# Patient Record
Sex: Male | Born: 1935 | Race: White | Hispanic: No | Marital: Married | Smoking: Former smoker
Health system: Southern US, Community
[De-identification: ages and names within clinical notes are randomized; demographics above are authoritative.]

## PROBLEM LIST (undated history)

## (undated) DIAGNOSIS — I10 Essential (primary) hypertension: Secondary | ICD-10-CM

## (undated) DIAGNOSIS — F419 Anxiety disorder, unspecified: Secondary | ICD-10-CM

## (undated) DIAGNOSIS — E039 Hypothyroidism, unspecified: Secondary | ICD-10-CM

## (undated) DIAGNOSIS — C4490 Unspecified malignant neoplasm of skin, unspecified: Secondary | ICD-10-CM

## (undated) DIAGNOSIS — F329 Major depressive disorder, single episode, unspecified: Secondary | ICD-10-CM

## (undated) DIAGNOSIS — E119 Type 2 diabetes mellitus without complications: Secondary | ICD-10-CM

## (undated) DIAGNOSIS — F32A Depression, unspecified: Secondary | ICD-10-CM

## (undated) HISTORY — PX: SKIN CANCER EXCISION: SHX779

---

## 2016-05-12 ENCOUNTER — Encounter: Payer: Self-pay | Admitting: Emergency Medicine

## 2016-05-12 ENCOUNTER — Emergency Department: Payer: Medicare Other

## 2016-05-12 ENCOUNTER — Observation Stay
Admission: EM | Admit: 2016-05-12 | Discharge: 2016-05-13 | Disposition: A | Discharge status: Home / Self Care | Payer: Medicare Other | Attending: Internal Medicine | Admitting: Internal Medicine

## 2016-05-12 DIAGNOSIS — F329 Major depressive disorder, single episode, unspecified: Secondary | ICD-10-CM | POA: Insufficient documentation

## 2016-05-12 DIAGNOSIS — R531 Weakness: Secondary | ICD-10-CM

## 2016-05-12 DIAGNOSIS — R2681 Unsteadiness on feet: Secondary | ICD-10-CM

## 2016-05-12 DIAGNOSIS — J101 Influenza due to other identified influenza virus with other respiratory manifestations: Secondary | ICD-10-CM | POA: Diagnosis not present

## 2016-05-12 DIAGNOSIS — E1165 Type 2 diabetes mellitus with hyperglycemia: Secondary | ICD-10-CM | POA: Insufficient documentation

## 2016-05-12 DIAGNOSIS — Z87891 Personal history of nicotine dependence: Secondary | ICD-10-CM | POA: Diagnosis not present

## 2016-05-12 DIAGNOSIS — I10 Essential (primary) hypertension: Secondary | ICD-10-CM | POA: Insufficient documentation

## 2016-05-12 DIAGNOSIS — R739 Hyperglycemia, unspecified: Secondary | ICD-10-CM

## 2016-05-12 DIAGNOSIS — Z79899 Other long term (current) drug therapy: Secondary | ICD-10-CM | POA: Insufficient documentation

## 2016-05-12 DIAGNOSIS — F419 Anxiety disorder, unspecified: Secondary | ICD-10-CM | POA: Diagnosis not present

## 2016-05-12 DIAGNOSIS — R410 Disorientation, unspecified: Secondary | ICD-10-CM | POA: Diagnosis present

## 2016-05-12 DIAGNOSIS — G9340 Encephalopathy, unspecified: Secondary | ICD-10-CM | POA: Insufficient documentation

## 2016-05-12 DIAGNOSIS — E039 Hypothyroidism, unspecified: Secondary | ICD-10-CM | POA: Insufficient documentation

## 2016-05-12 DIAGNOSIS — Z794 Long term (current) use of insulin: Secondary | ICD-10-CM | POA: Diagnosis not present

## 2016-05-12 DIAGNOSIS — E860 Dehydration: Secondary | ICD-10-CM

## 2016-05-12 HISTORY — DX: Unspecified malignant neoplasm of skin, unspecified: C44.90

## 2016-05-12 HISTORY — DX: Anxiety disorder, unspecified: F41.9

## 2016-05-12 HISTORY — DX: Depression, unspecified: F32.A

## 2016-05-12 HISTORY — DX: Type 2 diabetes mellitus without complications: E11.9

## 2016-05-12 HISTORY — DX: Essential (primary) hypertension: I10

## 2016-05-12 HISTORY — DX: Hypothyroidism, unspecified: E03.9

## 2016-05-12 HISTORY — DX: Major depressive disorder, single episode, unspecified: F32.9

## 2016-05-12 LAB — CBC WITH DIFFERENTIAL/PLATELET
BASOS PCT: 1 %
Basophils Absolute: 0.1 10*3/uL (ref 0–0.1)
Eosinophils Absolute: 0.1 10*3/uL (ref 0–0.7)
Eosinophils Relative: 1 %
HEMATOCRIT: 33.6 % — AB (ref 40.0–52.0)
Hemoglobin: 11.6 g/dL — ABNORMAL LOW (ref 13.0–18.0)
LYMPHS ABS: 0.7 10*3/uL — AB (ref 1.0–3.6)
LYMPHS PCT: 8 %
MCH: 30.7 pg (ref 26.0–34.0)
MCHC: 34.6 g/dL (ref 32.0–36.0)
MCV: 88.9 fL (ref 80.0–100.0)
MONO ABS: 1.1 10*3/uL — AB (ref 0.2–1.0)
MONOS PCT: 13 %
NEUTROS ABS: 6.6 10*3/uL — AB (ref 1.4–6.5)
Neutrophils Relative %: 77 %
Platelets: 225 10*3/uL (ref 150–440)
RBC: 3.77 MIL/uL — ABNORMAL LOW (ref 4.40–5.90)
RDW: 15.1 % — AB (ref 11.5–14.5)
WBC: 8.5 10*3/uL (ref 3.8–10.6)

## 2016-05-12 LAB — IRON AND TIBC
IRON: 14 ug/dL — AB (ref 45–182)
SATURATION RATIOS: 5 % — AB (ref 17.9–39.5)
TIBC: 267 ug/dL (ref 250–450)
UIBC: 253 ug/dL

## 2016-05-12 LAB — GLUCOSE, CAPILLARY
GLUCOSE-CAPILLARY: 166 mg/dL — AB (ref 65–99)
Glucose-Capillary: 346 mg/dL — ABNORMAL HIGH (ref 65–99)
Glucose-Capillary: 130 mg/dL — ABNORMAL HIGH (ref 65–99)

## 2016-05-12 LAB — URINALYSIS, COMPLETE (UACMP) WITH MICROSCOPIC
BILIRUBIN URINE: NEGATIVE
Bacteria, UA: NONE SEEN
Glucose, UA: >=500 mg/dL — AB
HGB URINE DIPSTICK: NEGATIVE
Ketones, ur: 5 mg/dL — AB
LEUKOCYTES UA: NEGATIVE
NITRITE: NEGATIVE
PH: 5 (ref 5.0–8.0)
Protein, ur: NEGATIVE mg/dL
RBC / HPF: NONE SEEN RBC/hpf (ref 0–5)
SPECIFIC GRAVITY, URINE: 1.017 (ref 1.005–1.030)

## 2016-05-12 LAB — COMPREHENSIVE METABOLIC PANEL
ALK PHOS: 74 U/L (ref 38–126)
ALT: 11 U/L — ABNORMAL LOW (ref 17–63)
ANION GAP: 9 (ref 5–15)
AST: 26 U/L (ref 15–41)
Albumin: 3.4 g/dL — ABNORMAL LOW (ref 3.5–5.0)
BILIRUBIN TOTAL: 0.6 mg/dL (ref 0.3–1.2)
BUN: 26 mg/dL — ABNORMAL HIGH (ref 6–20)
CALCIUM: 8.7 mg/dL — AB (ref 8.9–10.3)
CO2: 24 mmol/L (ref 22–32)
Chloride: 99 mmol/L — ABNORMAL LOW (ref 101–111)
Creatinine, Ser: 1.39 mg/dL — ABNORMAL HIGH (ref 0.61–1.24)
GFR calc non Af Amer: 45 mL/min — ABNORMAL LOW (ref >60)
GFR, EST AFRICAN AMERICAN: 52 mL/min — AB (ref >60)
Glucose, Bld: 360 mg/dL — ABNORMAL HIGH (ref 65–99)
POTASSIUM: 4.4 mmol/L (ref 3.5–5.1)
Sodium: 132 mmol/L — ABNORMAL LOW (ref 135–145)
TOTAL PROTEIN: 7 g/dL (ref 6.5–8.1)

## 2016-05-12 LAB — TROPONIN I: TROPONIN I: 0.03 ng/mL — AB (ref <0.03)

## 2016-05-12 LAB — VITAMIN B12: Vitamin B-12: 254 pg/mL (ref 180–914)

## 2016-05-12 LAB — INFLUENZA PANEL BY PCR (TYPE A & B)
Influenza A By PCR: POSITIVE — AB
Influenza B By PCR: NEGATIVE

## 2016-05-12 MED ORDER — LORAZEPAM 0.5 MG PO TABS
0.5000 mg | ORAL_TABLET | Freq: Two times a day (BID) | ORAL | Status: DC
  Administered 2016-05-12 – 2016-05-13 (×2): 0.5 mg via ORAL
  Filled 2016-05-12 (×2): qty 1

## 2016-05-12 MED ORDER — LEVOFLOXACIN 500 MG PO TABS: 250.0000 mg | ORAL_TABLET | Freq: Every day | ORAL | Status: DC

## 2016-05-12 MED ORDER — SODIUM CHLORIDE 0.9 % IV SOLN
INTRAVENOUS | Status: DC
  Administered 2016-05-12: 21:00:00 via INTRAVENOUS

## 2016-05-12 MED ORDER — TAMSULOSIN HCL 0.4 MG PO CAPS
0.4000 mg | ORAL_CAPSULE | Freq: Every evening | ORAL | Status: DC
  Administered 2016-05-12: 0.4 mg via ORAL
  Filled 2016-05-12: qty 1

## 2016-05-12 MED ORDER — METFORMIN HCL 500 MG PO TABS
1000.0000 mg | ORAL_TABLET | Freq: Two times a day (BID) | ORAL | Status: DC
  Administered 2016-05-13: 1000 mg via ORAL
  Filled 2016-05-12: qty 2

## 2016-05-12 MED ORDER — SODIUM CHLORIDE 0.9 % IV SOLN
Freq: Once | INTRAVENOUS | Status: AC
  Administered 2016-05-12: 11:00:00 via INTRAVENOUS

## 2016-05-12 MED ORDER — DONEPEZIL HCL 5 MG PO TABS
5.0000 mg | ORAL_TABLET | Freq: Every day | ORAL | Status: DC
  Administered 2016-05-12: 22:00:00 5 mg via ORAL
  Filled 2016-05-12: qty 1

## 2016-05-12 MED ORDER — INSULIN ASPART 100 UNIT/ML ~~LOC~~ SOLN
5.0000 [IU] | Freq: Once | SUBCUTANEOUS | Status: AC
  Administered 2016-05-12: 5 [IU] via SUBCUTANEOUS
  Filled 2016-05-12: qty 5

## 2016-05-12 MED ORDER — HEPARIN SODIUM (PORCINE) 5000 UNIT/ML IJ SOLN
5000.0000 [IU] | Freq: Three times a day (TID) | INTRAMUSCULAR | Status: DC
  Administered 2016-05-12 – 2016-05-13 (×2): 5000 [IU] via SUBCUTANEOUS
  Filled 2016-05-12 (×2): qty 1

## 2016-05-12 MED ORDER — SODIUM CHLORIDE 0.9 % IV SOLN
INTRAVENOUS | Status: DC
  Administered 2016-05-13 (×2): via INTRAVENOUS

## 2016-05-12 MED ORDER — CITALOPRAM HYDROBROMIDE 20 MG PO TABS
40.0000 mg | ORAL_TABLET | Freq: Every day | ORAL | Status: DC
  Administered 2016-05-13: 10:00:00 40 mg via ORAL
  Filled 2016-05-12: qty 2

## 2016-05-12 MED ORDER — OSELTAMIVIR PHOSPHATE 75 MG PO CAPS
75.0000 mg | ORAL_CAPSULE | Freq: Once | ORAL | Status: AC
  Administered 2016-05-12: 75 mg via ORAL
  Filled 2016-05-12: qty 1

## 2016-05-12 MED ORDER — OSELTAMIVIR PHOSPHATE 30 MG PO CAPS
30.0000 mg | ORAL_CAPSULE | Freq: Two times a day (BID) | ORAL | Status: DC
  Administered 2016-05-12 – 2016-05-13 (×2): 30 mg via ORAL
  Filled 2016-05-12 (×2): qty 1

## 2016-05-12 MED ORDER — ONDANSETRON HCL 4 MG/2ML IJ SOLN: 4.0000 mg | Freq: Four times a day (QID) | INTRAMUSCULAR | Status: DC | PRN

## 2016-05-12 MED ORDER — INSULIN GLARGINE 100 UNIT/ML ~~LOC~~ SOLN
18.0000 [IU] | Freq: Every day | SUBCUTANEOUS | Status: DC
  Administered 2016-05-13: 18 [IU] via SUBCUTANEOUS
  Filled 2016-05-12 (×2): qty 0.18

## 2016-05-12 MED ORDER — LEVOTHYROXINE SODIUM 100 MCG PO TABS
100.0000 ug | ORAL_TABLET | Freq: Every day | ORAL | Status: DC
  Administered 2016-05-13: 100 ug via ORAL
  Filled 2016-05-12: qty 1

## 2016-05-12 MED ORDER — RISPERIDONE 0.5 MG PO TABS
0.2500 mg | ORAL_TABLET | Freq: Every evening | ORAL | Status: DC
  Administered 2016-05-12: 22:00:00 0.25 mg via ORAL
  Filled 2016-05-12: qty 0.5
  Filled 2016-05-12: qty 1

## 2016-05-12 MED ORDER — FINASTERIDE 5 MG PO TABS
5.0000 mg | ORAL_TABLET | Freq: Every day | ORAL | Status: DC
Start: 2016-05-13 — End: 2016-05-13
  Administered 2016-05-13: 10:00:00 5 mg via ORAL
  Filled 2016-05-12: qty 1

## 2016-05-12 MED ORDER — ONDANSETRON HCL 4 MG PO TABS
4.0000 mg | ORAL_TABLET | Freq: Four times a day (QID) | ORAL | Status: DC | PRN
Start: 2016-05-12 — End: 2016-05-13

## 2016-05-12 MED ORDER — LISINOPRIL 20 MG PO TABS
40.0000 mg | ORAL_TABLET | Freq: Every day | ORAL | Status: DC
  Administered 2016-05-13: 40 mg via ORAL
  Filled 2016-05-12: qty 2

## 2016-05-12 MED ORDER — LEVOFLOXACIN 500 MG PO TABS
500.0000 mg | ORAL_TABLET | Freq: Once | ORAL | Status: AC
  Administered 2016-05-12: 22:00:00 500 mg via ORAL
  Filled 2016-05-12: qty 1

## 2016-05-12 MED ORDER — GUAIFENESIN-DM 100-10 MG/5ML PO SYRP
5.0000 mL | ORAL_SOLUTION | ORAL | Status: DC | PRN
  Administered 2016-05-12: 22:00:00 5 mL via ORAL
  Filled 2016-05-12: qty 5

## 2016-05-12 MED ORDER — ACETAMINOPHEN 650 MG RE SUPP: 650.0000 mg | Freq: Four times a day (QID) | RECTAL | Status: DC | PRN

## 2016-05-12 MED ORDER — FAMOTIDINE 20 MG PO TABS
10.0000 mg | ORAL_TABLET | Freq: Two times a day (BID) | ORAL | Status: DC
  Administered 2016-05-12 – 2016-05-13 (×2): 10 mg via ORAL
  Filled 2016-05-12 (×2): qty 1

## 2016-05-12 MED ORDER — ACETAMINOPHEN 325 MG PO TABS
650.0000 mg | ORAL_TABLET | Freq: Four times a day (QID) | ORAL | Status: DC | PRN
Start: 2016-05-12 — End: 2016-05-13
  Administered 2016-05-12: 650 mg via ORAL
  Filled 2016-05-12: qty 2

## 2016-05-12 MED ORDER — SODIUM CHLORIDE 0.9% FLUSH
3.0000 mL | Freq: Two times a day (BID) | INTRAVENOUS | Status: DC
  Administered 2016-05-12: 3 mL via INTRAVENOUS

## 2016-05-12 MED ORDER — SIMVASTATIN 40 MG PO TABS
40.0000 mg | ORAL_TABLET | Freq: Every day | ORAL | Status: DC
  Administered 2016-05-12: 40 mg via ORAL
  Filled 2016-05-12: qty 1

## 2016-05-12 MED ORDER — GLIPIZIDE 5 MG PO TABS
10.0000 mg | ORAL_TABLET | Freq: Two times a day (BID) | ORAL | Status: DC
Start: 2016-05-12 — End: 2016-05-13
  Administered 2016-05-12 – 2016-05-13 (×2): 10 mg via ORAL
  Filled 2016-05-12 (×2): qty 2

## 2016-05-12 MED ORDER — TERAZOSIN HCL 1 MG PO CAPS
5.0000 mg | ORAL_CAPSULE | Freq: Every evening | ORAL | Status: DC
  Administered 2016-05-12: 5 mg via ORAL
  Filled 2016-05-12 (×2): qty 5

## 2016-05-12 MED ORDER — OSELTAMIVIR PHOSPHATE 75 MG PO CAPS: 75.0000 mg | ORAL_CAPSULE | Freq: Two times a day (BID) | ORAL | 0 refills | Status: DC

## 2016-05-12 NOTE — H&P (Signed)
[facility] NAME: Randall Boyd    MR#:  [id number]  DATE OF BIRTH:  1930-07-11  DATE OF ADMISSION:  05/12/2016  PRIMARY CARE PHYSICIAN: Velta Addison, Claretha Cooper, DO   REQUESTING/REFERRING PHYSICIAN: Lucita Lora MD  CHIEF COMPLAINT:   Chief Complaint  Patient presents with  . Weakness  . Hyperglycemia    HISTORY OF PRESENT ILLNESS: Randall Boyd  is a 83 y.o. male with a known history of Anxiety, depression, diabetes, essential hypertension, hypothyroidism who currently resides in independent living facility sent from there for weakness and hyperglycemia. Patient was diagnosed with the flu in the emergency room. And they were planning to discharge him to skilled nursing facility however patient had episodes of nausea and vomiting. Also normal facility is willing to take him from here due to him having the flu. Patient currently is confused and unable to provide any review of systems. His daughter is at bedside she reports that normally he is mentally alert and ambulates on his own and is very functional.  PAST MEDICAL HISTORY:   Past Medical History:  Diagnosis Date  . Anxiety   . Depression   . DM (diabetes mellitus) ([place])   . Hypertension   . Hypothyroidism   . Skin cancer     PAST SURGICAL HISTORY: Past Surgical History:  Procedure Laterality Date  . SKIN CANCER EXCISION      SOCIAL HISTORY:  Social History  Substance Use Topics  . Smoking status: Former Research scientist (life sciences)  . Smokeless tobacco: Not on file  . Alcohol use No    FAMILY HISTORY: History reviewed. No pertinent family history.  DRUG ALLERGIES: No Known Allergies  REVIEW OF SYSTEMS:   CONSTITUTIONALUnable to provide due to his mental status MEDICATIONS AT HOME:  Prior to Admission medications   Medication Sig Start Date End Date Taking? Authorizing Provider  amLODipine (NORVASC) 10 MG tablet Take 10 mg by mouth daily. 03/08/16  Yes Historical Provider, MD  citalopram  (CELEXA) 40 MG tablet Take 40 mg by mouth daily. 03/08/16  Yes Historical Provider, MD  donepezil (ARICEPT) 5 MG tablet Take 5 mg by mouth at bedtime. 02/28/16  Yes Historical Provider, MD  finasteride (PROSCAR) 5 MG tablet Take 5 mg by mouth daily. 03/08/16  Yes Historical Provider, MD  furosemide (LASIX) 20 MG tablet Take 20 mg by mouth every other day.  04/19/16  Yes Historical Provider, MD  glipiZIDE (GLUCOTROL) 10 MG tablet Take 10 mg by mouth 2 (two) times daily. 03/13/16  Yes Historical Provider, MD  LANTUS SOLOSTAR 100 UNIT/ML Solostar Pen Inject 18 Units into the muscle daily. 04/08/16  Yes Historical Provider, MD  levothyroxine (SYNTHROID, LEVOTHROID) 100 MCG tablet Take 100 mcg by mouth daily. 02/08/16  Yes Historical Provider, MD  lisinopril (PRINIVIL,ZESTRIL) 40 MG tablet Take 40 mg by mouth daily. 03/06/16  Yes Historical Provider, MD  LORazepam (ATIVAN) 0.5 MG tablet Take 0.5 mg by mouth 2 (two) times daily. 1 tab qam and 2 tabs qpm 03/23/16  Yes Historical Provider, MD  metFORMIN (GLUCOPHAGE) 1000 MG tablet Take 1,000 mg by mouth 2 (two) times daily. 04/09/16  Yes Historical Provider, MD  ranitidine (ZANTAC) 150 MG tablet Take 150 mg by mouth 2 (two) times daily. 02/28/16  Yes Historical Provider, MD  risperiDONE (RISPERDAL) 0.25 MG tablet Take 0.25 mg by mouth every evening.  04/10/16  Yes Historical Provider, MD  simvastatin (ZOCOR) 40 MG tablet Take 40 mg by mouth at bedtime. 03/08/16  Yes Historical Provider, MD  tamsulosin (FLOMAX) 0.4 MG CAPS capsule Take 0.4 mg by mouth every evening.  03/08/16  Yes Historical Provider, MD  terazosin (HYTRIN) 5 MG capsule Take 5 mg by mouth every evening.  03/08/16  Yes Historical Provider, MD  oseltamivir (TAMIFLU) 75 MG capsule Take 1 capsule (75 mg total) by mouth 2 (two) times daily. 05/12/16 05/22/16  Earleen Newport, MD      PHYSICAL EXAMINATION:   VITAL SIGNS: Blood pressure (!) 158/75, pulse 91, temperature 99.1 F (37.3 C),  temperature source Oral, resp. rate (!) 24, height 6\' 1"  (1.854 m), weight 190 lb (86.2 kg), SpO2 92 %.  GENERAL:  83 y.o.-year-old patient lying in the bed Appears chronically ill EYES: Pupils equal, round, reactive to light and accommodation. No scleral icterus. Extraocular muscles intact.  HEENT: Head atraumatic, normocephalic. Oropharynx and nasopharynx clear.  NECK:  Supple, no jugular venous distention. No thyroid enlargement, no tenderness.  LUNGS: Normal breath sounds bilaterally, no wheezing, rales,rhonchi or crepitation. No use of accessory muscles of respiration.  CARDIOVASCULAR: S1, S2 normal. No murmurs, rubs, or gallops.  ABDOMEN: Soft, nontender, nondistended. Bowel sounds present. No organomegaly or mass.  EXTREMITIES: No pedal edema, cyanosis, or clubbing.  NEUROLOGIC: Moving all extremities PSYCHIATRIC: The patient is alert not oriented SKIN: No obvious rash, lesion, or ulcer.   LABORATORY PANEL:   CBC  Recent Labs Lab 05/12/16 1010  WBC 8.5  HGB 11.6*  HCT 33.6*  PLT 225  MCV 88.9  MCH 30.7  MCHC 34.6  RDW 15.1*  LYMPHSABS 0.7*  MONOABS 1.1*  EOSABS 0.1  BASOSABS 0.1   ------------------------------------------------------------------------------------------------------------------  Chemistries   Recent Labs Lab 05/12/16 1010  NA 132*  K 4.4  CL 99*  CO2 24  GLUCOSE 360*  BUN 26*  CREATININE 1.39*  CALCIUM 8.7*  AST 26  ALT 11*  ALKPHOS 74  BILITOT 0.6   ------------------------------------------------------------------------------------------------------------------ estimated creatinine clearance is 44.7 mL/min (by C-G formula based on SCr of 1.39 mg/dL (H)). ------------------------------------------------------------------------------------------------------------------ No results for input(s): TSH, T4TOTAL, T3FREE, THYROIDAB in the last 72 hours.  Invalid input(s): FREET3   Coagulation profile No results for input(s): INR, PROTIME  in the last 168 hours. ------------------------------------------------------------------------------------------------------------------- No results for input(s): DDIMER in the last 72 hours. -------------------------------------------------------------------------------------------------------------------  Cardiac Enzymes  Recent Labs Lab 05/12/16 1010  TROPONINI 0.03*   ------------------------------------------------------------------------------------------------------------------ Invalid input(s): POCBNP  ---------------------------------------------------------------------------------------------------------------  Urinalysis    Component Value Date/Time   COLORURINE YELLOW (A) 05/12/2016 1010   APPEARANCEUR CLEAR (A) 05/12/2016 1010   LABSPEC 1.017 05/12/2016 1010   PHURINE 5.0 05/12/2016 1010   GLUCOSEU >=500 (A) 05/12/2016 1010   HGBUR NEGATIVE 05/12/2016 1010   BILIRUBINUR NEGATIVE 05/12/2016 1010   KETONESUR 5 (A) 05/12/2016 1010   PROTEINUR NEGATIVE 05/12/2016 1010   NITRITE NEGATIVE 05/12/2016 1010   LEUKOCYTESUR NEGATIVE 05/12/2016 1010     RADIOLOGY: Dg Chest 1 View  Result Date: 05/12/2016 CLINICAL DATA:  Weakness and nonproductive cough EXAM: CHEST 1 VIEW COMPARISON:  None. FINDINGS: The heart size and mediastinal contours are within normal limits. Both lungs are clear. The visualized skeletal structures are unremarkable. IMPRESSION: No active disease. Electronically Signed   By: Inez Catalina M.D.   On: 05/12/2016 12:10    EKG: Orders placed or performed during the hospital encounter of 05/12/16  . ED EKG  . ED EKG  . EKG 12-Lead  . EKG 12-Lead    IMPRESSION AND PLAN: Patient is a 83 year old brought in for altered  mental status and weakness  1. Acute encephalopathy related to the flu We will give him IV fluids treat his flu symptoms Monitor his mental status  2. Influenza A positive we'll treat with Tamiflu    3. Essential hypertension   lisinopril   4. Diabetes type 2 continue Glucotrol and Lantus will place on sliding scale insulin  5. hypothyroidism continue Synthroid  6. Miscellaneous Lovenox for DVT prophylaxis All the records are reviewed and case discussed with ED provider. Management plans discussed with the patient, family and they are in agreement.  CODE STATUS: Code Status History    This patient does not have a recorded code status. Please follow your organizational policy for patients in this situation.    Advance Directive Documentation   Flowsheet Row Most Recent Value  Type of Advance Directive  Out of facility DNR (pink MOST or yellow form)  Pre-existing out of facility DNR order (yellow form or pink MOST form)  Pink MOST form placed in chart (order not valid for inpatient use)  "MOST" Form in Place?  No data       TOTAL TIME TAKING CARE OF THIS PATIENT: 50 minutes.    Dustin Flock M.D on 05/12/2016 at 6:27 PM  Between 7am to 6pm - Pager - [phone]  After 6pm go to [email or web address] - password [email or web address] [facility]  Office  [phone]  CC: Primary care physician; Velta Addison, Claretha Cooper, DO

## 2016-05-12 NOTE — ED Provider Notes (Signed)
[facility] Emergency Department Provider Note        Time seen: ----------------------------------------- 10:08 AM on 05/12/2016 -----------------------------------------    I have reviewed the triage vital signs and the nursing notes.   HISTORY  Chief Complaint No chief complaint on file.    HPI IAM SENDER is a 83 y.o. male who presents to the ER for generalized weakness. Patient is a resident of an assisted living facility. Reportedly his blood sugar was elevated this morning. He denies any other specific complaints. He denies recent illness, changes in his medicines or pain. Patient states he has had a normal appetite.   No past medical history on file.  There are no active problems to display for this patient.   No past surgical history on file.  Allergies Patient has no allergy information on record.  Social History Social History  Substance Use Topics  . Smoking status: Not on file  . Smokeless tobacco: Not on file  . Alcohol use Not on file    Review of Systems Constitutional: Negative for fever. Cardiovascular: Negative for chest pain. Respiratory: Negative for shortness of breath. Gastrointestinal: Negative for abdominal pain, vomiting and diarrhea. Musculoskeletal: Negative for back pain. Skin: Negative for rash. Neurological: Negative for headaches, Positive for generalized weakness  10-point ROS otherwise negative.  ____________________________________________   PHYSICAL EXAM:  VITAL SIGNS: ED Triage Vitals  Enc Vitals Group     BP      Pulse      Resp      Temp      Temp src      SpO2      Weight      Height      Head Circumference      Peak Flow      Pain Score      Pain Loc      Pain Edu?      Excl. in [place]?     Constitutional: Alert and oriented. Well appearing and in no distress. Eyes: Conjunctivae are normal.  Normal extraocular movements. ENT   Head: Normocephalic and atraumatic.    Nose: Rhinorrhea is noted   Mouth/Throat: Mucous membranes are moist.   Neck: No stridor. Cardiovascular: Normal rate, regular rhythm. No murmurs, rubs, or gallops. Respiratory: Normal respiratory effort without tachypnea nor retractions. Breath sounds are clear and equal bilaterally. No wheezes/rales/rhonchi. Gastrointestinal: Soft and nontender. Normal bowel sounds Musculoskeletal: Nontender with normal range of motion in all extremities. Lower extremity edema is noted Neurologic:  Normal speech and language. No gross focal neurologic deficits are appreciated.  Skin:  Skin is warm, dry and intact. No rash noted. Psychiatric: Mood and affect are normal. Speech and behavior are normal.  ____________________________________________  EKG: Interpreted by me. Sinus rhythm with a rate of 91 bpm, prolonged PR interval, incomplete right bundle branch block, likely old inferior infarct age indeterminate. Normal QT interval.  ____________________________________________  ED COURSE:  Pertinent labs & imaging results that were available during my care of the patient were reviewed by me and considered in my medical decision making (see chart for details). Patient presents to the ER with weakness of uncertain etiology. We will assess with labs and possibly imaging. Clinical Course as of May 12 1325  Wed May 12, 2016  1155 Iron: (!) 14 [JW]  1155 Iron: (!) 14 [JW]    Clinical Course User Index [JW] Earleen Newport, MD   Procedures ____________________________________________   LABS (pertinent positives/negatives)  Labs Reviewed  CBC WITH DIFFERENTIAL/PLATELET - Abnormal; Notable for the following:       Result Value   RBC 3.77 (*)    Hemoglobin 11.6 (*)    HCT 33.6 (*)    RDW 15.1 (*)    Neutro Abs 6.6 (*)    Lymphs Abs 0.7 (*)    Monocytes Absolute 1.1 (*)    All other components within normal limits  COMPREHENSIVE METABOLIC PANEL - Abnormal; Notable for the following:     Sodium 132 (*)    Chloride 99 (*)    Glucose, Bld 360 (*)    BUN 26 (*)    Creatinine, Ser 1.39 (*)    Calcium 8.7 (*)    Albumin 3.4 (*)    ALT 11 (*)    GFR calc non Af Amer 45 (*)    GFR calc Af Amer 52 (*)    All other components within normal limits  TROPONIN I - Abnormal; Notable for the following:    Troponin I 0.03 (*)    All other components within normal limits  URINALYSIS, COMPLETE (UACMP) WITH MICROSCOPIC - Abnormal; Notable for the following:    Color, Urine YELLOW (*)    APPearance CLEAR (*)    Glucose, UA >=500 (*)    Ketones, ur 5 (*)    Squamous Epithelial / LPF 0-5 (*)    All other components within normal limits  GLUCOSE, CAPILLARY - Abnormal; Notable for the following:    Glucose-Capillary 346 (*)    All other components within normal limits  IRON AND TIBC - Abnormal; Notable for the following:    Iron 14 (*)    Saturation Ratios 5 (*)    All other components within normal limits  INFLUENZA PANEL BY PCR (TYPE A & B) - Abnormal; Notable for the following:    Influenza A By PCR POSITIVE (*)    All other components within normal limits  GLUCOSE, CAPILLARY - Abnormal; Notable for the following:    Glucose-Capillary 166 (*)    All other components within normal limits  HEMOGLOBIN A1C - Abnormal; Notable for the following:    Hgb A1c MFr Bld 8.4 (*)    All other components within normal limits  CBC - Abnormal; Notable for the following:    RBC 3.62 (*)    Hemoglobin 10.8 (*)    HCT 32.1 (*)    RDW 15.5 (*)    All other components within normal limits  BASIC METABOLIC PANEL - Abnormal; Notable for the following:    Glucose, Bld 104 (*)    Calcium 8.4 (*)    GFR calc non Af Amer 55 (*)    All other components within normal limits  GLUCOSE, CAPILLARY - Abnormal; Notable for the following:    Glucose-Capillary 130 (*)    All other components within normal limits  VITAMIN B12  CBG MONITORING, ED    RADIOLOGY  Chest x-ray Is  unremarkable ____________________________________________  FINAL ASSESSMENT AND PLAN  Generalized weakness, hyperglycemia, dehydration  Plan: Patient with labs and imaging as dictated above. Tentative plan is for [facility] resp[facility]. At this point vital signs remained stable. Patient care checked out to Dr. Dineen Kid.   Earleen Newport, MD   Note: This note was generated in part or whole with voice recognition software. Voice recognition is usually quite accurate but there are transcription errors that can and very often do occur. I apologize for any typographical errors that were not detected  and corrected.     Earleen Newport, MD 05/13/16 [phone]

## 2016-05-12 NOTE — Progress Notes (Signed)
ANTIBIOTIC CONSULT NOTE - INITIAL  Pharmacy Consult for Levaquin  Indication: pneumonia  No Known Allergies  Patient Measurements: Height: 6\' 1"  (185.4 cm) Weight: 190 lb (86.2 kg) IBW/kg (Calculated) : 79.9 Adjusted Body Weight:   Vital Signs: Temp: 101.9 F (38.8 C) (01/31 1930) Temp Source: Oral (01/31 1930) BP: 149/68 (01/31 1930) Pulse Rate: 81 (01/31 1930) Intake/Output from previous day: No intake/output data recorded. Intake/Output from this shift: No intake/output data recorded.  Labs:  Recent Labs  05/12/16 1010  WBC 8.5  HGB 11.6*  PLT 225  CREATININE 1.39*   Estimated Creatinine Clearance: 44.7 mL/min (by C-G formula based on SCr of 1.39 mg/dL (H)). No results for input(s): VANCOTROUGH, VANCOPEAK, VANCORANDOM, GENTTROUGH, GENTPEAK, GENTRANDOM, TOBRATROUGH, TOBRAPEAK, TOBRARND, AMIKACINPEAK, AMIKACINTROU, AMIKACIN in the last 72 hours.   Microbiology: No results found for this or any previous visit (from the past 720 hour(s)).  Medical History: Past Medical History:  Diagnosis Date  . Anxiety   . Depression   . DM (diabetes mellitus) ([place])   . Hypertension   . Hypothyroidism   . Skin cancer     Medications:  Prescriptions Prior to Admission  Medication Sig Dispense Refill Last Dose  . amLODipine (NORVASC) 10 MG tablet Take 10 mg by mouth daily.  3 05/12/2016 at 0700  . citalopram (CELEXA) 40 MG tablet Take 40 mg by mouth daily.  3 05/12/2016 at 0700  . donepezil (ARICEPT) 5 MG tablet Take 5 mg by mouth at bedtime.  3 05/11/2016 at pm  . finasteride (PROSCAR) 5 MG tablet Take 5 mg by mouth daily.  3 05/12/2016 at 0700  . furosemide (LASIX) 20 MG tablet Take 20 mg by mouth every other day.   3 unknown at Unknown time  . glipiZIDE (GLUCOTROL) 10 MG tablet Take 10 mg by mouth 2 (two) times daily.  1 05/12/2016 at 0700  . LANTUS SOLOSTAR 100 UNIT/ML Solostar Pen Inject 18 Units into the muscle daily.  3 05/12/2016 at 0700  . levothyroxine (SYNTHROID,  LEVOTHROID) 100 MCG tablet Take 100 mcg by mouth daily.  3 05/12/2016 at 0700  . lisinopril (PRINIVIL,ZESTRIL) 40 MG tablet Take 40 mg by mouth daily.  1 05/12/2016 at 0700  . LORazepam (ATIVAN) 0.5 MG tablet Take 0.5 mg by mouth 2 (two) times daily. 1 tab qam and 2 tabs qpm  3 05/12/2016 at 0700  . metFORMIN (GLUCOPHAGE) 1000 MG tablet Take 1,000 mg by mouth 2 (two) times daily.  3 05/12/2016 at 0700  . ranitidine (ZANTAC) 150 MG tablet Take 150 mg by mouth 2 (two) times daily.  3 05/12/2016 at 0700  . risperiDONE (RISPERDAL) 0.25 MG tablet Take 0.25 mg by mouth every evening.   3 05/11/2016 at pm  . simvastatin (ZOCOR) 40 MG tablet Take 40 mg by mouth at bedtime.  3 05/11/2016 at pm  . tamsulosin (FLOMAX) 0.4 MG CAPS capsule Take 0.4 mg by mouth every evening.   3 05/11/2016 at pm  . terazosin (HYTRIN) 5 MG capsule Take 5 mg by mouth every evening.   3 05/11/2016 at p   Assessment: CrCl = 44.7 ml/min   Goal of Therapy:  resolution of infection  Plan:  Expected duration 7 days with resolution of temperature and/or normalization of WBC   Levaquin 500 mg PO daily originally ordered.   Will adjust dose to levaquin 500 mg PO X 1 to be given on 1/31 followed by levaquin 250 mg PO daily to starty on 2/1.  Khloi Rawl D 05/12/2016,8:45 PM

## 2016-05-12 NOTE — ED Notes (Signed)
CSW followed up with the following referrals for 7 day respite stay for the pt;  [facility], Per [employer] facility is not accepting pts with the flu.  [facility], Per [place], no beds available.   [facility], Per [place], no beds available.   [facility], no answer. CSW left a message on Andrea's voicemail.  [facility], per the hub, pt has been declined.   CSW will inform pt and pt's family of above and coordinate d/c to [facility].  Georga Kaufmann, MSW, [place]

## 2016-05-12 NOTE — ED Provider Notes (Signed)
  Patient was seen and evaluated by Dr. Jimmye Norman. Patient found to be influenza positive. Given Tamiflu. Plan was for the patient to possibly go to a higher level of care because he has an independent living currently and is unable to walk by himself now secondary to his current illness. However, a higher level of care would not accept him because of his positive flu diagnosis, per nursing. Family says that they are able to care for the patient and he vomited twice now in the emergency department. He will be admitted to the hospital.  Physical Exam  BP (!) 158/75   Pulse 91   Temp 99.1 F (37.3 C) (Oral)   Resp (!) 24   Ht 6\' 1"  (1.854 m)   Wt 190 lb (86.2 kg)   SpO2 92%   BMI 25.07 kg/m   Physical Exam Patient coughing while lying in the bed. Appears weak but is awake and alert. ED Course  Procedures  MDM Signed out to Dr. Posey Pronto.       Orbie Pyo, MD 05/12/16 [phone]

## 2016-05-12 NOTE — NC FL2 (Signed)
[place] LEVEL OF CARE SCREENING TOOL     IDENTIFICATION  Patient Name: Randall Boyd Birthdate: 02/17/1931 Sex: male Admission Date (Current Location): 05/12/2016  [facility] and [place] Number:  Engineering geologist and Address:  [facility], [address] [place], [place] [address]      Provider Number: [id number]  Attending Physician Name and Address:  Earleen Newport, MD  Relative Name and Phone Number:       Current Level of Care: Hospital Recommended Level of Care: [place] Prior Approval Number:    Date Approved/Denied:   PASRR Number: [id number]  Discharge Plan: SNF    Current Diagnoses:  Dehydration  Weakness  Influenza A  Hyperglycemia    Orientation RESPIRATION BLADDER Height & Weight     Self, Time, Situation, Place  Normal Continent Weight: 190 lb (86.2 kg) Height:  6\' 1"  (185.4 cm)  BEHAVIORAL SYMPTOMS/MOOD NEUROLOGICAL BOWEL NUTRITION STATUS      Continent Diet (Heart healthy)  AMBULATORY STATUS COMMUNICATION OF NEEDS Skin   Extensive Assist Verbally Normal                       Personal Care Assistance Level of Assistance  Bathing, Feeding, Dressing Bathing Assistance: Limited assistance Feeding assistance: Limited assistance Dressing Assistance: Limited assistance     Functional Limitations Info  Sight, Hearing, Speech Sight Info: Adequate Hearing Info: Impaired (Hard of hearing) Speech Info: Adequate    SPECIAL CARE FACTORS FREQUENCY   (Pt currently has the flu type A)                    Contractures Contractures Info: Not present    Additional Factors Info  Code Status, Allergies Code Status Info: Not on file  Allergies Info: No known allergies            Current Medications (05/12/2016):  This is the current hospital active medication list No current facility-administered medications for this encounter.    Current Outpatient Prescriptions   Medication Sig Dispense Refill  . amLODipine (NORVASC) 10 MG tablet Take 10 mg by mouth daily.  3  . citalopram (CELEXA) 40 MG tablet Take 40 mg by mouth daily.  3  . donepezil (ARICEPT) 5 MG tablet Take 5 mg by mouth at bedtime.  3  . finasteride (PROSCAR) 5 MG tablet Take 5 mg by mouth daily.  3  . furosemide (LASIX) 20 MG tablet Take 20 mg by mouth every other day.   3  . glipiZIDE (GLUCOTROL) 10 MG tablet Take 10 mg by mouth 2 (two) times daily.  1  . LANTUS SOLOSTAR 100 UNIT/ML Solostar Pen Inject 18 Units into the muscle daily.  3  . levothyroxine (SYNTHROID, LEVOTHROID) 100 MCG tablet Take 100 mcg by mouth daily.  3  . lisinopril (PRINIVIL,ZESTRIL) 40 MG tablet Take 40 mg by mouth daily.  1  . LORazepam (ATIVAN) 0.5 MG tablet Take 0.5 mg by mouth 2 (two) times daily. 1 tab qam and 2 tabs qpm  3  . metFORMIN (GLUCOPHAGE) 1000 MG tablet Take 1,000 mg by mouth 2 (two) times daily.  3  . ranitidine (ZANTAC) 150 MG tablet Take 150 mg by mouth 2 (two) times daily.  3  . risperiDONE (RISPERDAL) 0.25 MG tablet Take 0.25 mg by mouth every evening.   3  . simvastatin (ZOCOR) 40 MG tablet Take 40 mg by mouth at bedtime.  3  . tamsulosin (  FLOMAX) 0.4 MG CAPS capsule Take 0.4 mg by mouth every evening.   3  . terazosin (HYTRIN) 5 MG capsule Take 5 mg by mouth every evening.   3  . oseltamivir (TAMIFLU) 75 MG capsule Take 1 capsule (75 mg total) by mouth 2 (two) times daily. 20 capsule 0     Discharge Medications: Please see discharge summary for a list of discharge medications.  Relevant Imaging Results:  Relevant Lab Results:   Additional Information SSN [id number]  Georga Kaufmann, LCSWA

## 2016-05-12 NOTE — ED Triage Notes (Signed)
Pt presents to ED from [facility] via [facility] with c/o weakness and non productive cough. Pt is noted to have non-productive cough on assessment. Per EMS pt checked CBG at approx 0800 and it was 200, per EMS upon their arrival checked CBG 409. Pt presents to ED alert and oriented at this time.

## 2016-05-12 NOTE — ED Notes (Signed)
This RN to bedside at this time to apologize for this RN's absence due to an emergency. Pt and daughter in room at this time. Pt states understanding. This RN will continue to monitor for further patient needs.

## 2016-05-12 NOTE — ED Notes (Signed)
This RN to bedside, changed patient's brief and sheets due to being soiled. Pt is noted to be weak at this time, no change in patient condition, pt's daughter at bedside at this time.

## 2016-05-12 NOTE — ED Notes (Signed)
Pt's daughter states she will be taking all of the pt's belongings home with her once he gets to the inpatient room on 1C.

## 2016-05-12 NOTE — Clinical Social Work Note (Signed)
Clinical Social Work Assessment  Patient Details  Name: Randall Boyd MRN: [id number] Date of Birth: December 17, 1930  Date of referral:  05/12/16               Reason for consult:  Facility Placement                Permission sought to share information with:  Family Supports, Customer service manager Permission granted to share information::     Name::     Belenda Cruise (daughter) [phone]  Agency::     Relationship::     Contact Information:     Housing/Transportation Living arrangements for the past 2 months:  [place] of Information:  Adult Children Patient Interpreter Needed:  None Criminal Activity/Legal Involvement Pertinent to Current Situation/Hospitalization:  No - Comment as needed Significant Relationships:  Adult Children, Other Family Members Lives with:  Self Do you feel safe going back to the place where you live?  No Need for family participation in patient care:  Yes (Comment)  Care giving concerns: Pt has the flu and is currently unable to care for himself independently.   Social Worker assessment / plan:  CSW received consult for facility placement. Pt is a 83 yo white male who presents to the ED with the flu. Pt is a resident at [facility] independent living facility and lives alone. Since pt has fallen ill he has been unable to are for himself. Pt has a daughter that is involved in his care, however, she is unable to take the pt into her home at this time. CSW engaged with pt and pt's daughter at pt's bedside. Pt's daughter states that she is interested in a respite bed for the pt until he is able to recover from the flu. The pt and pt's daughter had a preference of [facility] and [facility]. CSW explained that respite stays are out of pocket and some facilities can charge as much as $200+ per day. Pt's daughter expressed her understanding and stated that she would still like to move forward with the process. CSW contacted  Broadus John at [facility] states that the facility is currently not accepting anyone with the flu. CSW then contacted Magda Paganini at [facility] and Magda Paganini states that their facility is accepting people with the flu at this time.   CSW completed FL2, submitted and received a 7 day respite stay PASRR number for the pt, and faxed out referrals for pt for a respite stay. Magda Paganini at [facility] is currently reviewing pt's referral. If pt is unable to receive a respite bed then pt will return home to [facility] and receive home health services through the facility. EDP informed.  Employment status:  Retired Nurse, adult PT Recommendations:  Not assessed at this time [place] / Referral to community resources:  [place]  Patient/Family's Response to care: Pt will d/c to SNF for respite care or will return to [place] with [facility] services.  Patient/Family's Understanding of and Emotional Response to Diagnosis, Current Treatment, and Prognosis: Pt and pt's family are appreciative of the care provided by CSW at this time.  Emotional Assessment Appearance:  Appears stated age Attitude/Demeanor/Rapport:  Lethargic Affect (typically observed):  Appropriate Orientation:  Oriented to Self, Oriented to Place, Oriented to  Time, Oriented to Situation Alcohol / Substance use:  Not Applicable Psych involvement (Current and /or in the community):  No (Comment)  Discharge Needs  Concerns to be  addressed:  Care Coordination, Discharge Planning Concerns Readmission within the last 30 days:  No Current discharge risk:  Dependent with Mobility, Lives alone Barriers to Discharge:   (Awaiting respite bed)   Georga Kaufmann, [place] 05/12/2016, 3:28 PM

## 2016-05-13 LAB — CBC
HCT: 32.1 % — ABNORMAL LOW (ref 40.0–52.0)
Hemoglobin: 10.8 g/dL — ABNORMAL LOW (ref 13.0–18.0)
MCH: 29.9 pg (ref 26.0–34.0)
MCHC: 33.8 g/dL (ref 32.0–36.0)
MCV: 88.6 fL (ref 80.0–100.0)
PLATELETS: 205 10*3/uL (ref 150–440)
RBC: 3.62 MIL/uL — AB (ref 4.40–5.90)
RDW: 15.5 % — AB (ref 11.5–14.5)
WBC: 6.7 10*3/uL (ref 3.8–10.6)

## 2016-05-13 LAB — BASIC METABOLIC PANEL
Anion gap: 7 (ref 5–15)
BUN: 18 mg/dL (ref 6–20)
CO2: 26 mmol/L (ref 22–32)
Calcium: 8.4 mg/dL — ABNORMAL LOW (ref 8.9–10.3)
Chloride: 104 mmol/L (ref 101–111)
Creatinine, Ser: 1.17 mg/dL (ref 0.61–1.24)
GFR calc Af Amer: >60 mL/min (ref >60)
GFR, EST NON AFRICAN AMERICAN: 55 mL/min — AB (ref >60)
Glucose, Bld: 104 mg/dL — ABNORMAL HIGH (ref 65–99)
POTASSIUM: 4 mmol/L (ref 3.5–5.1)
Sodium: 137 mmol/L (ref 135–145)

## 2016-05-13 LAB — GLUCOSE, CAPILLARY
Glucose-Capillary: 218 mg/dL — ABNORMAL HIGH (ref 65–99)
Glucose-Capillary: 246 mg/dL — ABNORMAL HIGH (ref 65–99)
Glucose-Capillary: 186 mg/dL — ABNORMAL HIGH (ref 65–99)
Glucose-Capillary: 89 mg/dL (ref 65–99)

## 2016-05-13 LAB — HEMOGLOBIN A1C
Hgb A1c MFr Bld: 8.4 % — ABNORMAL HIGH (ref 4.8–5.6)
Mean Plasma Glucose: 194 mg/dL

## 2016-05-13 MED ORDER — OSELTAMIVIR PHOSPHATE 75 MG PO CAPS: 75.0000 mg | ORAL_CAPSULE | Freq: Two times a day (BID) | ORAL | 0 refills | Status: DC

## 2016-05-13 MED ORDER — OSELTAMIVIR PHOSPHATE 75 MG PO CAPS: 75.0000 mg | ORAL_CAPSULE | Freq: Two times a day (BID) | ORAL | Status: DC

## 2016-05-13 MED ORDER — INSULIN ASPART 100 UNIT/ML ~~LOC~~ SOLN: 0.0000 [IU] | Freq: Three times a day (TID) | SUBCUTANEOUS | Status: DC

## 2016-05-13 MED ORDER — ENOXAPARIN SODIUM 40 MG/0.4ML ~~LOC~~ SOLN: 40.0000 mg | SUBCUTANEOUS | Status: DC

## 2016-05-13 NOTE — Clinical Social Work Placement (Signed)
   CLINICAL SOCIAL WORK PLACEMENT  NOTE  Date:  05/13/2016  Patient Details  Name: Randall Boyd MRN: [id number] Date of Birth: [birth year]  Clinical Social Work is seeking post-discharge placement for this patient at the [place] level of care (*CSW will initial, date and re-position this form in  chart as items are completed):  Yes   Patient/family provided with [place] Work Department's list of facilities offering this level of care within the geographic area requested by the patient (or if unable, by the patient's family).  Yes   Patient/family informed of their freedom to choose among providers that offer the needed level of care, that participate in Medicare, Medicaid or managed care program needed by the patient, have an available bed and are willing to accept the patient.  Yes   Patient/family informed of [facility]'s ownership interest in [facility] and [facility], as well as of the fact that they are under no obligation to receive care at these facilities.  PASRR submitted to EDS on 05/12/16     PASRR number received on 05/12/16     Existing PASRR number confirmed on       FL2 transmitted to all facilities in geographic area requested by pt/family on 05/13/16     FL2 transmitted to all facilities within larger geographic area on       Patient informed that his/her managed care company has contracts with or will negotiate with certain facilities, including the following:        Yes   Patient/family informed of bed offers received.  Patient chooses bed at  [facility] )     Physician recommends and patient chooses bed at      Patient to be transferred to  [facility]) on 05/13/16.  Patient to be transferred to facility by  [facility])     Patient family notified on 05/13/16 of transfer.  Name of family member notified:   (Patients daughter [name] was notified about patient's discharge. )      PHYSICIAN       Additional Comment:    _______________________________________________ Danie Chandler, [place] Work 05/13/2016, 2:40 PM

## 2016-05-13 NOTE — Progress Notes (Signed)
PT is recommending SNF. Clinical Social Worker (CSW) contacted patient's daughter [name] who is POA and explained that his insurance [employer] will pay for short term rehab. Daughter is agreeable to short term rehab. CSW explained that SNF beds are limited because patient is positive for the flu. CSW presented only bed offer to daughter [facility] and she accepted bed offer. CSW left [facility] admissions coordinator at [facility] a voicemail making him aware of accepted bed offer.   [employer], LCSW [phone]

## 2016-05-13 NOTE — Evaluation (Signed)
Physical Therapy Evaluation Patient Details Name: Randall Boyd MRN: [id number] DOB: 1930/09/08 Today's Date: 05/13/2016   History of Present Illness  Pt is a 83 yo male admitted due to vomitting nausea, weakness and altered mental status; diagnosed w/ Influenza A. Per hospitalist has a history of; HTN, Diabetes, Depression, anxiety and skin cancer.    Clinical Impression  Pt awake alert and responsive to commands throughout PT eval. Pt displays decreased strength and activity tolerance for functional tasks; required min assist to move from sitting to supine and min to mod assist with transferring to standing. RW was used in stance and ambulation for additional stability. Pt displayed incontinence issues in stance and nursing staff was notified. Able to perform sit to stand transfers with min to mod assist to allow for functional activity of changing briefs. Pt ambulated 40' w/ a slow shuffling gait pattern using a RW and min assist from Pt. Pt took wide turns and appeared unsteady w/ multiple short steps. He currently displays strength, balance and ambulatory deficits that limit safe household mobility and will benefit from skilled PT to correct above deficits. Recommend pt discharge to STR following acute hospitalization.     Follow Up Recommendations SNF    Equipment Recommendations       Recommendations for Other Services OT consult     Precautions / Restrictions Precautions Precautions: Fall Restrictions Weight Bearing Restrictions: No      Mobility  Bed Mobility Overal bed mobility: Needs Assistance Bed Mobility: Supine to Sit     Supine to sit: Min assist     General bed mobility comments: heavy reliance of bed rail, min assist and cuing to bring trunk up to sitting   Transfers Overall transfer level: Needs assistance Equipment used: Rolling walker (2 wheeled) Transfers: Sit to/from Stand Sit to Stand: Min assist         General transfer comment: bilat UE for  push off, increase effort to straighten LE and obtain upright posture   Ambulation/Gait Ambulation/Gait assistance: Min assist Ambulation Distance (Feet): 40 Feet Assistive device: Rolling walker (2 wheeled) Gait Pattern/deviations: Step-through pattern;Decreased step length - right;Decreased step length - left;Decreased stride length;Shuffle   Gait velocity interpretation: Below normal speed for age/gender General Gait Details: wide turns w/ multiple steps, decreased gait speed and stated he felt unsteady  Stairs            Wheelchair Mobility    Modified Rankin (Stroke Patients Only)       Balance Overall balance assessment: Needs assistance Sitting-balance support: Bilateral upper extremity supported Sitting balance-Leahy Scale: Good Sitting balance - Comments: UE support required to sit independently    Standing balance support: Bilateral upper extremity supported Standing balance-Leahy Scale: Fair Standing balance comment: RW for additional stability, minor lateral swaying                             Pertinent Vitals/Pain Pain Assessment: No/denies pain Pain Location: L Hip     Home Living Family/patient expects to be discharged to:: Assisted living (Independent living facility) Living Arrangements: Alone             Home Equipment: Walker - 2 wheels Additional Comments: uses walker for longer ambulation    Prior Function Level of Independence: Independent with assistive device(s)               Hand Dominance   Dominant Hand: Right    Extremity/Trunk Assessment  Upper Extremity Assessment Upper Extremity Assessment: Overall WFL for tasks assessed (grossly at least 4/5)    Lower Extremity Assessment Lower Extremity Assessment: Overall WFL for tasks assessed (grossly at least 4/5)       Communication   Communication: No difficulties  Cognition Arousal/Alertness: Awake/alert Behavior During Therapy: WFL for tasks  assessed/performed Overall Cognitive Status: Within Functional Limits for tasks assessed                      General Comments      Exercises Other Exercises Other Exercises: Transfers sit to stand with functional activities of changing briefs; 1x5 required min to mod assist to transfer to standing w/ RW,    Assessment/Plan    PT Assessment Patient needs continued PT services  PT Problem List Decreased activity tolerance;Decreased balance;Decreased mobility;Decreased safety awareness;Decreased knowledge of use of DME;Decreased strength          PT Treatment Interventions DME instruction;Gait training;Functional mobility training;Balance training;Therapeutic exercise;Therapeutic activities;Patient/family education    PT Goals (Current goals can be found in the Care Plan section)  Acute Rehab PT Goals Patient Stated Goal: return home PT Goal Formulation: With patient Time For Goal Achievement: 05/27/16 Potential to Achieve Goals: Good    Frequency Min 2X/week   Barriers to discharge Decreased caregiver support;Inaccessible home environment      Co-evaluation               End of Session Equipment Utilized During Treatment: Gait belt Activity Tolerance: Patient limited by fatigue Patient left: in chair;with chair alarm set;with nursing/sitter in room;with call bell/phone within reach;with family/visitor present Nurse Communication: Mobility status         Time: [id number] PT Time Calculation (min) (ACUTE ONLY): 51 min   Charges:         PT G Codes:        [employer] student PT 05/13/2016, 9:33 AM

## 2016-05-13 NOTE — Progress Notes (Addendum)
Patient is medically stable for discharge today to [facility]. Per, the admissions coordinator at [facility], patient can come to room 38A. Social work Patent examiner discharge orders in [place]. Social work Theatre manager met with patient at bedside and explained that patient will discharge today to [facility]. Patient verbally agreed he understood. Social work Theatre manager spoke to patient's daughter [name] making her aware of patient's discharge today. RN will call report and arrange EMS. Please re-consult if future social work needs arise. Social work Architectural technologist off.   Danie Chandler, Social Work Intern  [phone]

## 2016-05-13 NOTE — Clinical Social Work Placement (Signed)
   CLINICAL SOCIAL WORK PLACEMENT  NOTE  Date:  05/13/2016  Patient Details  Name: Randall Boyd MRN: [id number] Date of Birth: 05/10/1930  Clinical Social Work is seeking post-discharge placement for this patient at the [place] level of care (*CSW will initial, date and re-position this form in  chart as items are completed):  Yes   Patient/family provided with [place] Work Department's list of facilities offering this level of care within the geographic area requested by the patient (or if unable, by the patient's family).  Yes   Patient/family informed of their freedom to choose among providers that offer the needed level of care, that participate in Medicare, Medicaid or managed care program needed by the patient, have an available bed and are willing to accept the patient.  Yes   Patient/family informed of [facility]'s ownership interest in [facility] and [facility], as well as of the fact that they are under no obligation to receive care at these facilities.  PASRR submitted to EDS on 05/12/16     PASRR number received on 05/12/16     Existing PASRR number confirmed on       FL2 transmitted to all facilities in geographic area requested by pt/family on 05/13/16     FL2 transmitted to all facilities within larger geographic area on       Patient informed that his/her managed care company has contracts with or will negotiate with certain facilities, including the following:        Yes   Patient/family informed of bed offers received.  Patient chooses bed at  [facility] )     Physician recommends and patient chooses bed at      Patient to be transferred to   on  .  Patient to be transferred to facility by       Patient family notified on   of transfer.  Name of family member notified:        PHYSICIAN       Additional Comment:    _______________________________________________ Chontel Warning, Veronia Beets,  LCSW 05/13/2016, 9:54 AM

## 2016-05-13 NOTE — Discharge Summary (Signed)
[facility] [place] NAME: Randall Boyd    MR#:  [id number]  DATE OF BIRTH:  02/11/1931  DATE OF ADMISSION:  05/12/2016 ADMITTING PHYSICIAN: Dustin Flock, MD  DATE OF DISCHARGE: 05/13/16  PRIMARY CARE PHYSICIAN: Velta Addison, Claretha Cooper, DO    ADMISSION DIAGNOSIS:  Dehydration [E86.0] Weakness [R53.1] Influenza A [J10.1] Hyperglycemia [R73.9]  DISCHARGE DIAGNOSIS:  Influenza A generalized weakness  SECONDARY DIAGNOSIS:   Past Medical History:  Diagnosis Date  . Anxiety   . Depression   . DM (diabetes mellitus) ([place])   . Hypertension   . Hypothyroidism   . Skin cancer     HOSPITAL COURSE:   83 year old brought in for altered mental status and weakness  1. Acute encephalopathy related to Influenza A -on po tamiflu -afebrile, sats >92% on Ra -mentation at baseline -received IVF  2. Influenza A positive we'll treat with Tamiflu for 8 doses  3. Essential hypertension  -cont lisinopril   4. Diabetes type 2 -continue metformin and Lantus will place on sliding scale insulin  5. hypothyroidism continue Synthroid  PT recommends rehab. D/w dter Coralyn Mark D/c to Rehab today CONSULTS OBTAINED:    DRUG ALLERGIES:  No Known Allergies  DISCHARGE MEDICATIONS:   Current Discharge Medication List    START taking these medications   Details  oseltamivir (TAMIFLU) 75 MG capsule Take 1 capsule (75 mg total) by mouth 2 (two) times daily. Qty: 8 capsule, Refills: 0      CONTINUE these medications which have NOT CHANGED   Details  amLODipine (NORVASC) 10 MG tablet Take 10 mg by mouth daily. Refills: 3    citalopram (CELEXA) 40 MG tablet Take 40 mg by mouth daily. Refills: 3    donepezil (ARICEPT) 5 MG tablet Take 5 mg by mouth at bedtime. Refills: 3    finasteride (PROSCAR) 5 MG tablet Take 5 mg by mouth daily. Refills: 3    furosemide (LASIX) 20 MG tablet Take 20 mg by mouth every other day.  Refills: 3     LANTUS SOLOSTAR 100 UNIT/ML Solostar Pen Inject 18 Units into the muscle daily. Refills: 3    levothyroxine (SYNTHROID, LEVOTHROID) 100 MCG tablet Take 100 mcg by mouth daily. Refills: 3    lisinopril (PRINIVIL,ZESTRIL) 40 MG tablet Take 40 mg by mouth daily. Refills: 1    LORazepam (ATIVAN) 0.5 MG tablet Take 0.5 mg by mouth 2 (two) times daily. 1 tab qam and 2 tabs qpm Refills: 3    metFORMIN (GLUCOPHAGE) 1000 MG tablet Take 1,000 mg by mouth 2 (two) times daily. Refills: 3    ranitidine (ZANTAC) 150 MG tablet Take 150 mg by mouth 2 (two) times daily. Refills: 3    risperiDONE (RISPERDAL) 0.25 MG tablet Take 0.25 mg by mouth every evening.  Refills: 3    simvastatin (ZOCOR) 40 MG tablet Take 40 mg by mouth at bedtime. Refills: 3    tamsulosin (FLOMAX) 0.4 MG CAPS capsule Take 0.4 mg by mouth every evening.  Refills: 3    terazosin (HYTRIN) 5 MG capsule Take 5 mg by mouth every evening.  Refills: 3      STOP taking these medications     glipiZIDE (GLUCOTROL) 10 MG tablet         If you experience worsening of your admission symptoms, develop shortness of breath, life threatening emergency, suicidal or homicidal thoughts you must seek medical attention immediately by calling 911 or calling your MD immediately  if symptoms less severe.  You Must read complete instructions/literature along with all the possible adverse reactions/side effects for all the Medicines you take and that have been prescribed to you. Take any new Medicines after you have completely understood and accept all the possible adverse reactions/side effects.   Please note  You were cared for by a hospitalist during your hospital stay. If you have any questions about your discharge medications or the care you received while you were in the hospital after you are discharged, you can call the unit and asked to speak with the hospitalist on call if the hospitalist that took care of you is not available. Once  you are discharged, your primary care physician will handle any further medical issues. Please note that NO REFILLS for any discharge medications will be authorized once you are discharged, as it is imperative that you return to your primary care physician (or establish a relationship with a primary care physician if you do not have one) for your aftercare needs so that they can reassess your need for medications and monitor your lab values. Today   SUBJECTIVE    Overall [place]bale VITAL SIGNS:  Blood pressure 131/61, pulse 70, temperature 97.7 F (36.5 C), temperature source Oral, resp. rate 18, height 6\' 1"  (1.854 m), weight 86.2 kg (190 lb), SpO2 94 %.  I/O:   Intake/Output Summary (Last 24 hours) at 05/13/16 1349 Last data filed at 05/13/16 1300  Gross per 24 hour  Intake          1059.17 ml  Output              200 ml  Net           859.17 ml    PHYSICAL EXAMINATION:  GENERAL:  83 y.o.-year-old patient lying in the bed with no acute distress.  EYES: Pupils equal, round, reactive to light and accommodation. No scleral icterus. Extraocular muscles intact.  HEENT: Head atraumatic, normocephalic. Oropharynx and nasopharynx clear.  NECK:  Supple, no jugular venous distention. No thyroid enlargement, no tenderness.  LUNGS: Normal breath sounds bilaterally, no wheezing, rales,rhonchi or crepitation. No use of accessory muscles of respiration.  CARDIOVASCULAR: S1, S2 normal. No murmurs, rubs, or gallops.  ABDOMEN: Soft, non-tender, non-distended. Bowel sounds present. No organomegaly or mass.  EXTREMITIES: No pedal edema, cyanosis, or clubbing.  NEUROLOGIC: Cranial nerves II through XII are intact. Muscle strength 5/5 in all extremities. Sensation intact. Gait not checked.  PSYCHIATRIC: The patient is alert and oriented x 3.  SKIN: No obvious rash, lesion, or ulcer.   DATA REVIEW:   CBC   Recent Labs Lab 05/13/16 0447  WBC 6.7  HGB 10.8*  HCT 32.1*  PLT 205    Chemistries    Recent Labs Lab 05/12/16 1010 05/13/16 0447  NA 132* 137  K 4.4 4.0  CL 99* 104  CO2 24 26  GLUCOSE 360* 104*  BUN 26* 18  CREATININE 1.39* 1.17  CALCIUM 8.7* 8.4*  AST 26  --   ALT 11*  --   ALKPHOS 74  --   BILITOT 0.6  --     Microbiology Results   No results found for this or any previous visit (from the past 240 hour(s)).  RADIOLOGY:  Dg Chest 1 View  Result Date: 05/12/2016 CLINICAL DATA:  Weakness and nonproductive cough EXAM: CHEST 1 VIEW COMPARISON:  None. FINDINGS: The heart size and mediastinal contours are within normal limits. Both lungs are clear. The visualized skeletal  structures are unremarkable. IMPRESSION: No active disease. Electronically Signed   By: Inez Catalina M.D.   On: 05/12/2016 12:10     Management plans discussed with the patient, family and they are in agreement.  CODE STATUS:     Code Status Orders        Start     Ordered   05/13/16 0230  Do not attempt resuscitation (DNR)  Continuous    Question Answer Comment  In the event of cardiac or respiratory ARREST Do not call a "code blue"   In the event of cardiac or respiratory ARREST Do not perform Intubation, CPR, defibrillation or ACLS   In the event of cardiac or respiratory ARREST Use medication by any route, position, wound care, and other measures to relive pain and suffering. May use oxygen, suction and manual treatment of airway obstruction as needed for comfort.      05/13/16 0231    Code Status History    Date Active Date Inactive Code Status Order ID Comments User Context   05/12/2016  8:47 PM 05/13/2016  2:31 AM Full Code [id number]  Dustin Flock, MD Inpatient    Advance Directive Documentation   Flowsheet Row Most Recent Value  Type of Advance Directive  Out of facility DNR (pink MOST or yellow form)  Pre-existing out of facility DNR order (yellow form or pink MOST form)  Pink MOST form placed in chart (order not valid for inpatient use)  "MOST" Form in Place?  No data       TOTAL TIME TAKING CARE OF THIS PATIENT: 40 minutes.    Lindbergh Winkles M.D on 05/13/2016 at 1:49 PM  Between 7am to 6pm - Pager - [phone] After 6pm go to [email or web address] - password [email or web address] [place] [facility]  Office  [phone]  CC: Primary care physician; Velta Addison, Claretha Cooper, DO

## 2016-05-13 NOTE — Care Management (Signed)
Admitted to this facility with the diagnosis of confusion under observation status. Primary care physician is Dr. Velta Addison. A resident of [facility] since last August. Gets meals from [employer]. Receives physical therapy weekly from [employer]. Granddaughter helps with baths and dressing.      Daughter and Grand-daughter are at the bedside.    Physical therapy evaluation completed. Recommends skilled nursing facility.                                                                           Shelbie Ammons RN MSN CCM Care Management

## 2016-05-13 NOTE — Progress Notes (Signed)
Report called to Caren Griffins, Therapist, sports at [facility] and EMS contacted for transport.  Pt's daughter at bedside and is aware.  Clarise Cruz, RN

## 2016-05-13 NOTE — Progress Notes (Signed)
Inpatient Diabetes Program Recommendations  AACE/ADA: New Consensus Statement on Inpatient Glycemic Control (2015)  Target Ranges:  Prepandial:   less than 140 mg/dL      Peak postprandial:   less than 180 mg/dL (1-2 hours)      Critically ill patients:  140 - 180 mg/dL   Lab Results  Component Value Date   GLUCAP 218 (H) 05/13/2016   HGBA1C 8.4 (H) 05/12/2016    Review of Glycemic Control  Results for Randall Boyd, Randall Boyd (MRN [id number]) as of 05/13/2016 10:07  Ref. Range 05/12/2016 10:17 05/12/2016 14:31 05/12/2016 21:05 05/13/2016 10:00  Glucose-Capillary Latest Ref Range: 65 - 99 mg/dL 346 (H) 166 (H) 130 (H) 218 (H)    Diabetes history: Type 2 Outpatient Diabetes medications: Lantus 18 units qhs, Metformin 1000mg  bid, Glipizide 10mg  bid Current orders for Inpatient glycemic control: Lantus 18 units qhs, Metformin 1000mg  bid, Glipizide 10mg  bid  Inpatient Diabetes Program Recommendations:  For safety, consider d/c Glipizide while patient is in the hospital .  Consider CBG tid and hs, Novolog 0-9 units tid  Consider changing diet to 2 gram sodium - carb controlled  Gentry Fitz, RN, [place], [place], CDE Diabetes Coordinator Inpatient Diabetes Program  [phone] (Team Pager) [phone] ([place]) 05/13/2016 10:12 AM

## 2016-05-13 NOTE — Care Management Obs Status (Signed)
[place] NOTIFICATION   Patient Details  Name: Randall Boyd MRN: [id number] Date of Birth: 1930-07-31   Medicare Observation Status Notification Given:  Yes    Shelbie Ammons, RN 05/13/2016, 10:15 AM

## 2016-05-13 NOTE — Progress Notes (Signed)
Made Dr. Posey Pronto aware that pt has order for Lantus this morning however no order for CBG checks.  Verbal order given for CBG AC & HS.  Dr. Posey Pronto to put in SS insulin order if she feels needed.  Clarise Cruz, RN

## 2016-05-13 NOTE — Progress Notes (Signed)
Pt transported to [facility] via EMS.  Pt's daughter at bedside.  Clarise Cruz, RN

## 2016-09-07 ENCOUNTER — Encounter: Payer: Self-pay | Admitting: Emergency Medicine

## 2016-09-07 ENCOUNTER — Emergency Department: Payer: Medicare Other

## 2016-09-07 ENCOUNTER — Emergency Department
Admission: EM | Admit: 2016-09-07 | Discharge: 2016-09-07 | Disposition: A | Discharge status: Home / Self Care | Payer: Medicare Other | Attending: Emergency Medicine | Admitting: Emergency Medicine

## 2016-09-07 DIAGNOSIS — Z87891 Personal history of nicotine dependence: Secondary | ICD-10-CM | POA: Insufficient documentation

## 2016-09-07 DIAGNOSIS — R410 Disorientation, unspecified: Secondary | ICD-10-CM | POA: Diagnosis not present

## 2016-09-07 DIAGNOSIS — E039 Hypothyroidism, unspecified: Secondary | ICD-10-CM | POA: Insufficient documentation

## 2016-09-07 DIAGNOSIS — Z79899 Other long term (current) drug therapy: Secondary | ICD-10-CM | POA: Diagnosis not present

## 2016-09-07 DIAGNOSIS — E1122 Type 2 diabetes mellitus with diabetic chronic kidney disease: Secondary | ICD-10-CM | POA: Diagnosis not present

## 2016-09-07 DIAGNOSIS — I129 Hypertensive chronic kidney disease with stage 1 through stage 4 chronic kidney disease, or unspecified chronic kidney disease: Secondary | ICD-10-CM | POA: Insufficient documentation

## 2016-09-07 DIAGNOSIS — Z7984 Long term (current) use of oral hypoglycemic drugs: Secondary | ICD-10-CM | POA: Insufficient documentation

## 2016-09-07 DIAGNOSIS — R4182 Altered mental status, unspecified: Secondary | ICD-10-CM | POA: Diagnosis present

## 2016-09-07 DIAGNOSIS — N189 Chronic kidney disease, unspecified: Secondary | ICD-10-CM

## 2016-09-07 LAB — CBC WITH DIFFERENTIAL/PLATELET
BASOS ABS: 0.1 10*3/uL (ref 0–0.1)
BASOS PCT: 1 %
EOS PCT: 0 %
Eosinophils Absolute: 0 10*3/uL (ref 0–0.7)
HCT: 35.3 % — ABNORMAL LOW (ref 40.0–52.0)
Hemoglobin: 11.6 g/dL — ABNORMAL LOW (ref 13.0–18.0)
LYMPHS PCT: 11 %
Lymphs Abs: 1.2 10*3/uL (ref 1.0–3.6)
MCH: 29.1 pg (ref 26.0–34.0)
MCHC: 33 g/dL (ref 32.0–36.0)
MCV: 88.3 fL (ref 80.0–100.0)
MONO ABS: 1.1 10*3/uL — AB (ref 0.2–1.0)
MONOS PCT: 11 %
Neutro Abs: 8.4 10*3/uL — ABNORMAL HIGH (ref 1.4–6.5)
Neutrophils Relative %: 77 %
PLATELETS: 328 10*3/uL (ref 150–440)
RBC: 3.99 MIL/uL — ABNORMAL LOW (ref 4.40–5.90)
RDW: 17 % — AB (ref 11.5–14.5)
WBC: 10.9 10*3/uL — ABNORMAL HIGH (ref 3.8–10.6)

## 2016-09-07 LAB — BASIC METABOLIC PANEL
Anion gap: 10 (ref 5–15)
BUN: 38 mg/dL — AB (ref 6–20)
CALCIUM: 9.4 mg/dL (ref 8.9–10.3)
CO2: 25 mmol/L (ref 22–32)
Chloride: 100 mmol/L — ABNORMAL LOW (ref 101–111)
Creatinine, Ser: 1.79 mg/dL — ABNORMAL HIGH (ref 0.61–1.24)
GFR calc Af Amer: 38 mL/min — ABNORMAL LOW (ref >60)
GFR, EST NON AFRICAN AMERICAN: 33 mL/min — AB (ref >60)
GLUCOSE: 153 mg/dL — AB (ref 65–99)
Potassium: 5.3 mmol/L — ABNORMAL HIGH (ref 3.5–5.1)
Sodium: 135 mmol/L (ref 135–145)

## 2016-09-07 LAB — URINALYSIS, COMPLETE (UACMP) WITH MICROSCOPIC
Bilirubin Urine: NEGATIVE
GLUCOSE, UA: NEGATIVE mg/dL
Hgb urine dipstick: NEGATIVE
Ketones, ur: NEGATIVE mg/dL
Nitrite: NEGATIVE
PH: 5 (ref 5.0–8.0)
Protein, ur: NEGATIVE mg/dL
SPECIFIC GRAVITY, URINE: 1.01 (ref 1.005–1.030)
SQUAMOUS EPITHELIAL / LPF: NONE SEEN

## 2016-09-07 MED ORDER — SODIUM CHLORIDE 0.9 % IV BOLUS (SEPSIS)
1000.0000 mL | Freq: Once | INTRAVENOUS | Status: AC
  Administered 2016-09-07: 1000 mL via INTRAVENOUS

## 2016-09-07 NOTE — ED Notes (Signed)
Pt given meal tray and something to drink per order.

## 2016-09-07 NOTE — ED Notes (Signed)
Pt ate all food provided. Pt states  "it hit the spot"

## 2016-09-07 NOTE — Discharge Instructions (Signed)
You were sent to the ED for evaluation without any acute complaints.  Your blood tests do not show any acute issues today.  You are possibly dehydrated, for which we gave you IV fluids.  Follow up with your doctor for continued monitoring of your symptoms.

## 2016-09-07 NOTE — ED Provider Notes (Signed)
[facility] Emergency Department Provider Note  ____________________________________________  Time seen: Approximately 11:20 AM  I have reviewed the triage vital signs and the nursing notes.   HISTORY  Chief Complaint Altered Mental Status  Level 5 caveat:  Portions of the history and physical were unable to be obtained due to the patient's altered mental status and chronic dementia   HPI Randall Boyd is a 83 y.o. male sent to the emergency department for a reported heart rate in the 50s this morning from his nursing home. The report that normally it is about 70 and they're concerned that it was 10-15 bpm lower. No changes in medication regimen. EMS also report that from different staff they heard the patient had altered mental status and others said that he was at his normal baseline. Patient denies any complaints. Reports he has been eating and drinking normally. No pain anywhere. Has a chronic cough that is not concerning to him and denies shortness of breath.     Past Medical History:  Diagnosis Date  . Anxiety   . Depression   . DM (diabetes mellitus) ([place])   . Hypertension   . Hypothyroidism   . Skin cancer      Patient Active Problem List   Diagnosis Date Noted  . Confusion 05/12/2016     Past Surgical History:  Procedure Laterality Date  . SKIN CANCER EXCISION       Prior to Admission medications   Medication Sig Start Date End Date Taking? Authorizing Provider  amLODipine (NORVASC) 10 MG tablet Take 10 mg by mouth daily. 03/08/16  Yes [provider]  atorvastatin (LIPITOR) 20 MG tablet Take 20 mg by mouth daily.   Yes [provider]  citalopram (CELEXA) 40 MG tablet Take 40 mg by mouth daily. 03/08/16  Yes [provider]  donepezil (ARICEPT) 10 MG tablet Take 10 mg by mouth at bedtime.  02/28/16  Yes [provider]  finasteride (PROSCAR) 5 MG tablet Take 5 mg by mouth daily. 03/08/16  Yes  [provider]  fluconazole (DIFLUCAN) 150 MG tablet Take 150 mg by mouth daily.   Yes [provider]  furosemide (LASIX) 20 MG tablet Take 20 mg by mouth daily.  04/19/16  Yes [provider]  LANTUS SOLOSTAR 100 UNIT/ML Solostar Pen Inject 30 Units into the muscle daily.  04/08/16  Yes [provider]  levothyroxine (SYNTHROID, LEVOTHROID) 100 MCG tablet Take 100 mcg by mouth daily. 02/08/16  Yes [provider]  lisinopril (PRINIVIL,ZESTRIL) 40 MG tablet Take 40 mg by mouth daily. 03/06/16  Yes [provider]  LORazepam (ATIVAN) 0.5 MG tablet Take 0.5-1 mg by mouth 2 (two) times daily. 1 tab qam and 2 tabs qpm 03/23/16  Yes [provider]  metFORMIN (GLUCOPHAGE) 1000 MG tablet Take 1,000 mg by mouth 2 (two) times daily. 04/09/16  Yes [provider]  metoprolol tartrate (LOPRESSOR) 25 MG tablet Take 25 mg by mouth 2 (two) times daily.   Yes [provider]  potassium chloride (K-DUR) 10 MEQ tablet Take 10 mEq by mouth daily.   Yes [provider]  ranitidine (ZANTAC) 150 MG tablet Take 150 mg by mouth 2 (two) times daily. 02/28/16  Yes [provider]  risperiDONE (RISPERDAL) 0.25 MG tablet Take 0.25 mg by mouth every evening.  04/10/16  Yes [provider]  tamsulosin (FLOMAX) 0.4 MG CAPS capsule Take 0.4 mg by mouth every evening.  03/08/16  Yes [provider]  terazosin (HYTRIN) 5 MG capsule Take 5 mg by mouth every evening.  03/08/16  Yes [provider]  oseltamivir (TAMIFLU) 75 MG capsule Take 1 capsule (75 mg total) by mouth 2 (two) times daily. Patient not taking: Reported on 09/07/2016 05/13/16   Fritzi Mandes, MD     Allergies Patient has no known allergies.   History reviewed. No pertinent family history.  Social History Social History  Substance Use Topics  . Smoking status: Former Research scientist (life sciences)  . Smokeless tobacco: Never Used  . Alcohol use No     Review of Systems Unable to obtain due to chronic dementia  ____________________________________________   PHYSICAL EXAM:  VITAL SIGNS: ED Triage Vitals  Enc Vitals Group     BP 09/07/16 1032 (!) 107/47     Pulse Rate 09/07/16 1032 (!) 55     Resp 09/07/16 1032 18     Temp 09/07/16 1032 98 F (36.7 C)     Temp Source 09/07/16 1032 Oral     SpO2 09/07/16 1032 99 %     Weight 09/07/16 1031 190 lb (86.2 kg)     Height --      Head Circumference --      Peak Flow --      Pain Score --      Pain Loc --      Pain Edu? --      Excl. in [place]? --     Vital signs reviewed, nursing assessments reviewed.   Constitutional:   Alert and oriented To person. Well appearing and in no distress. Eyes:   No scleral icterus. No conjunctival pallor. PERRL. EOMI.  No nystagmus. ENT   Head:   Normocephalic and atraumatic.   Nose:   No congestion/rhinnorhea.    Mouth/Throat:   MMM, no pharyngeal erythema. No peritonsillar mass.    Neck:   No meningismus. Full ROM Hematological/Lymphatic/Immunilogical:   No cervical lymphadenopathy. Cardiovascular:   RRR, heart rate 60. Symmetric bilateral radial and DP pulses.  No murmurs.  Respiratory:   Normal respiratory effort without tachypnea/retractions. Breath sounds are clear and equal bilaterally. No wheezes/rales/rhonchi. Gastrointestinal:   Soft and nontender. Non distended. There is no CVA tenderness.  No rebound, rigidity, or guarding. Genitourinary:   deferred Musculoskeletal:   Normal range of motion in all extremities. No joint effusions.  No lower extremity tenderness.  No edema. Neurologic:   Normal speech and language.  CN 2-10 normal. Motor grossly intact. Normal coordination No gross focal neurologic deficits are appreciated.  Skin:    Skin is warm, dry and intact. No rash noted.  No petechiae, purpura, or bullae.  ____________________________________________    LABS (pertinent positives/negatives) (all labs ordered  are listed, but only abnormal results are displayed) Labs Reviewed  BASIC METABOLIC PANEL - Abnormal; Notable for the following:       Result Value   Potassium 5.3 (*)    Chloride 100 (*)    Glucose, Bld 153 (*)    BUN 38 (*)    Creatinine, Ser 1.79 (*)    GFR calc non Af Amer 33 (*)    GFR calc Af Amer 38 (*)    All other components within normal limits  CBC WITH DIFFERENTIAL/PLATELET - Abnormal; Notable for the following:    WBC 10.9 (*)    RBC 3.99 (*)    Hemoglobin 11.6 (*)    HCT 35.3 (*)    RDW 17.0 (*)    Neutro Abs 8.4 (*)  Monocytes Absolute 1.1 (*)    All other components within normal limits  URINALYSIS, COMPLETE (UACMP) WITH MICROSCOPIC - Abnormal; Notable for the following:    Color, Urine YELLOW (*)    APPearance CLEAR (*)    Leukocytes, UA TRACE (*)    Bacteria, UA RARE (*)    All other components within normal limits  URINE CULTURE   ____________________________________________   EKG  Interpreted by me Sinus bradycardia rate of 53, normal axis. First-degree AV block with PR interval 223. Right bundle branch block. No acute ischemic changes.  ____________________________________________    [facility]  Dg Chest Portable 1 View  Result Date: 09/07/2016 CLINICAL DATA:  Cough and bradycardia EXAM: PORTABLE CHEST 1 VIEW COMPARISON:  May 12, 2016 FINDINGS: There is scarring in the periphery of the right lower lobe, stable. There is no edema or consolidation. Heart is mildly enlarged with pulmonary vascularity within normal limits. There is aortic atherosclerosis. No adenopathy. There is degenerative change in each shoulder. IMPRESSION: Scarring right lower lobe, stable. No edema or consolidation. Cardiac prominence. Aortic atherosclerosis. Electronically Signed   By: Lowella Grip III M.D.   On: 09/07/2016 12:06    ____________________________________________   PROCEDURES Procedures  ____________________________________________   INITIAL  IMPRESSION / ASSESSMENT AND PLAN / ED COURSE  Pertinent labs & imaging results that were available during my care of the patient were reviewed by me and considered in my medical decision making (see chart for details).  Patient is into the emergency department for evaluation due to having a heart rate in the 50s this morning where usually it is about 70. There is a discrepancy between staff for some feel like he has altered mental status from his baseline of chronic dementia and others reports that he is exactly at his baseline. Currently patient has no complaints and exam is unremarkable. Vital signs are unremarkable. He is very well-appearing, well-hydrated, not in distress, and likely suitable for outpatient follow-up. Due to the limitations of history taking in the setting of his dementia, we'll do a screening workup was labs urinalysis and chest x-ray. Clinical Course as of Sep 07 1405  Tue Sep 07, 2016  1234 Labs show acute renal insufficiency. Patient otherwise asymptomatic. Electrolytes unremarkable. We'll give IV fluids for hydration and plan for outpatient follow-up.  [PS]    Clinical Course User Index [PS] Carrie Mew, MD      ----------------------------------------- 2:07 PM on 09/07/2016 -----------------------------------------  Further review in care everywhere shows creatinine is baseline. Discussed with family who report his heart rate is always in the 47s. He is completely at his normal baseline according to them. They do not understand why he was sent to the emergency department. They agree with discharge home and will follow up with primary care. ____________________________________________   FINAL CLINICAL IMPRESSION(S) / ED DIAGNOSES  Final diagnoses:  Chronic renal impairment, unspecified CKD stage  Confusion      New Prescriptions   No medications on file     Portions of this note were generated with dragon dictation software. Dictation errors may  occur despite best attempts at proofreading.    Carrie Mew, MD 09/07/16 1407

## 2016-09-07 NOTE — ED Triage Notes (Signed)
Pt from [facility]. Pt does not have any complaints and does not know why they sent him. Per EMS pt sent for altered mental status but per staff was oriented per his normal. Called and spoke with misty at [facility] and she reports he was clammy and his heart rate was in the 50s and is normally in 701s

## 2016-09-07 NOTE — ED Notes (Signed)
Denies chest pain or SOB at this time. Per facility they sent patient due to his HR in the 24s and usually in the 70s

## 2016-09-07 NOTE — ED Notes (Signed)
Pt given meal tray and milk. 

## 2016-09-08 LAB — URINE CULTURE: Culture: NO GROWTH

## 2016-11-04 ENCOUNTER — Emergency Department: Payer: Medicare Other

## 2016-11-04 ENCOUNTER — Encounter: Payer: Self-pay | Admitting: Emergency Medicine

## 2016-11-04 ENCOUNTER — Emergency Department
Admission: EM | Admit: 2016-11-04 | Discharge: 2016-11-04 | Disposition: A | Discharge status: Home / Self Care | Payer: Medicare Other | Attending: Emergency Medicine | Admitting: Emergency Medicine

## 2016-11-04 DIAGNOSIS — E119 Type 2 diabetes mellitus without complications: Secondary | ICD-10-CM | POA: Diagnosis not present

## 2016-11-04 DIAGNOSIS — I10 Essential (primary) hypertension: Secondary | ICD-10-CM | POA: Diagnosis not present

## 2016-11-04 DIAGNOSIS — Z85828 Personal history of other malignant neoplasm of skin: Secondary | ICD-10-CM | POA: Insufficient documentation

## 2016-11-04 DIAGNOSIS — Z79899 Other long term (current) drug therapy: Secondary | ICD-10-CM | POA: Diagnosis not present

## 2016-11-04 DIAGNOSIS — Z7984 Long term (current) use of oral hypoglycemic drugs: Secondary | ICD-10-CM | POA: Diagnosis not present

## 2016-11-04 DIAGNOSIS — Z87891 Personal history of nicotine dependence: Secondary | ICD-10-CM | POA: Insufficient documentation

## 2016-11-04 DIAGNOSIS — R531 Weakness: Secondary | ICD-10-CM | POA: Diagnosis present

## 2016-11-04 DIAGNOSIS — E039 Hypothyroidism, unspecified: Secondary | ICD-10-CM | POA: Diagnosis not present

## 2016-11-04 DIAGNOSIS — Z794 Long term (current) use of insulin: Secondary | ICD-10-CM | POA: Diagnosis not present

## 2016-11-04 DIAGNOSIS — R5381 Other malaise: Secondary | ICD-10-CM

## 2016-11-04 DIAGNOSIS — R5383 Other fatigue: Secondary | ICD-10-CM

## 2016-11-04 LAB — URINALYSIS, COMPLETE (UACMP) WITH MICROSCOPIC
BACTERIA UA: NONE SEEN
Bilirubin Urine: NEGATIVE
GLUCOSE, UA: NEGATIVE mg/dL
KETONES UR: NEGATIVE mg/dL
LEUKOCYTES UA: NEGATIVE
NITRITE: NEGATIVE
PH: 5 (ref 5.0–8.0)
PROTEIN: NEGATIVE mg/dL
Specific Gravity, Urine: 1.011 (ref 1.005–1.030)

## 2016-11-04 LAB — CBC WITH DIFFERENTIAL/PLATELET
BASOS ABS: 0.1 10*3/uL (ref 0–0.1)
Basophils Relative: 1 %
Eosinophils Absolute: 0.1 10*3/uL (ref 0–0.7)
Eosinophils Relative: 1 %
HEMATOCRIT: 33.6 % — AB (ref 40.0–52.0)
HEMOGLOBIN: 11.4 g/dL — AB (ref 13.0–18.0)
LYMPHS PCT: 20 %
Lymphs Abs: 2.1 10*3/uL (ref 1.0–3.6)
MCH: 30.1 pg (ref 26.0–34.0)
MCHC: 34.1 g/dL (ref 32.0–36.0)
MCV: 88.3 fL (ref 80.0–100.0)
Monocytes Absolute: 1.2 10*3/uL — ABNORMAL HIGH (ref 0.2–1.0)
Monocytes Relative: 12 %
NEUTROS ABS: 7 10*3/uL — AB (ref 1.4–6.5)
NEUTROS PCT: 66 %
Platelets: 295 10*3/uL (ref 150–440)
RBC: 3.8 MIL/uL — AB (ref 4.40–5.90)
RDW: 16.3 % — ABNORMAL HIGH (ref 11.5–14.5)
WBC: 10.5 10*3/uL (ref 3.8–10.6)

## 2016-11-04 LAB — COMPREHENSIVE METABOLIC PANEL
ALBUMIN: 2.9 g/dL — AB (ref 3.5–5.0)
ALT: 8 U/L — AB (ref 17–63)
AST: 18 U/L (ref 15–41)
Alkaline Phosphatase: 65 U/L (ref 38–126)
Anion gap: 8 (ref 5–15)
BUN: 23 mg/dL — AB (ref 6–20)
CHLORIDE: 103 mmol/L (ref 101–111)
CO2: 26 mmol/L (ref 22–32)
CREATININE: 1.65 mg/dL — AB (ref 0.61–1.24)
Calcium: 8.8 mg/dL — ABNORMAL LOW (ref 8.9–10.3)
GFR calc Af Amer: 42 mL/min — ABNORMAL LOW (ref >60)
GFR calc non Af Amer: 37 mL/min — ABNORMAL LOW (ref >60)
GLUCOSE: 61 mg/dL — AB (ref 65–99)
POTASSIUM: 4.1 mmol/L (ref 3.5–5.1)
SODIUM: 137 mmol/L (ref 135–145)
Total Bilirubin: 0.6 mg/dL (ref 0.3–1.2)
Total Protein: 6.6 g/dL (ref 6.5–8.1)

## 2016-11-04 LAB — GLUCOSE, CAPILLARY: Glucose-Capillary: 101 mg/dL — ABNORMAL HIGH (ref 65–99)

## 2016-11-04 LAB — LIPASE, BLOOD: Lipase: 48 U/L (ref 11–51)

## 2016-11-04 MED ORDER — SODIUM CHLORIDE 0.9 % IV BOLUS (SEPSIS)
500.0000 mL | Freq: Once | INTRAVENOUS | Status: AC
  Administered 2016-11-04: 500 mL via INTRAVENOUS

## 2016-11-04 NOTE — Discharge Instructions (Signed)
Your tests today did not reveal any acute issues.  Follow up with your doctor for review of medications which may have contributed to today's symptoms.    Results for orders placed or performed during the hospital encounter of 11/04/16  CBC with Differential  Result Value Ref Range   WBC 10.5 3.8 - 10.6 K/uL   RBC 3.80 (L) 4.40 - 5.90 MIL/uL   Hemoglobin 11.4 (L) 13.0 - 18.0 g/dL   HCT 33.6 (L) 40.0 - 52.0 %   MCV 88.3 80.0 - 100.0 fL   MCH 30.1 26.0 - 34.0 pg   MCHC 34.1 32.0 - 36.0 g/dL   RDW 16.3 (H) 11.5 - 14.5 %   Platelets 295 150 - 440 K/uL   Neutrophils Relative % 66 %   Neutro Abs 7.0 (H) 1.4 - 6.5 K/uL   Lymphocytes Relative 20 %   Lymphs Abs 2.1 1.0 - 3.6 K/uL   Monocytes Relative 12 %   Monocytes Absolute 1.2 (H) 0.2 - 1.0 K/uL   Eosinophils Relative 1 %   Eosinophils Absolute 0.1 0 - 0.7 K/uL   Basophils Relative 1 %   Basophils Absolute 0.1 0 - 0.1 K/uL  Urinalysis, Complete w Microscopic  Result Value Ref Range   Color, Urine YELLOW (A) YELLOW   APPearance CLEAR (A) CLEAR   Specific Gravity, Urine 1.011 1.005 - 1.030   pH 5.0 5.0 - 8.0   Glucose, UA NEGATIVE NEGATIVE mg/dL   Hgb urine dipstick SMALL (A) NEGATIVE   Bilirubin Urine NEGATIVE NEGATIVE   Ketones, ur NEGATIVE NEGATIVE mg/dL   Protein, ur NEGATIVE NEGATIVE mg/dL   Nitrite NEGATIVE NEGATIVE   Leukocytes, UA NEGATIVE NEGATIVE   RBC / HPF 0-5 0 - 5 RBC/hpf   WBC, UA 0-5 0 - 5 WBC/hpf   Bacteria, UA NONE SEEN NONE SEEN   Squamous Epithelial / LPF 0-5 (A) NONE SEEN   WBC Clumps PRESENT    Mucous PRESENT    Hyaline Casts, UA PRESENT   Comprehensive metabolic panel  Result Value Ref Range   Sodium 137 135 - 145 mmol/L   Potassium 4.1 3.5 - 5.1 mmol/L   Chloride 103 101 - 111 mmol/L   CO2 26 22 - 32 mmol/L   Glucose, Bld 61 (L) 65 - 99 mg/dL   BUN 23 (H) 6 - 20 mg/dL   Creatinine, Ser 1.65 (H) 0.61 - 1.24 mg/dL   Calcium 8.8 (L) 8.9 - 10.3 mg/dL   Total Protein 6.6 6.5 - 8.1 g/dL   Albumin 2.9  (L) 3.5 - 5.0 g/dL   AST 18 15 - 41 U/L   ALT 8 (L) 17 - 63 U/L   Alkaline Phosphatase 65 38 - 126 U/L   Total Bilirubin 0.6 0.3 - 1.2 mg/dL   GFR calc non Af Amer 37 (L) >60 mL/min   GFR calc Af Amer 42 (L) >60 mL/min   Anion gap 8 5 - 15  Lipase, blood  Result Value Ref Range   Lipase 48 11 - 51 U/L  Glucose, capillary  Result Value Ref Range   Glucose-Capillary 101 (H) 65 - 99 mg/dL   Ct Head Wo Contrast  Result Date: 11/04/2016 CLINICAL DATA:  Generalized weakness, dementia EXAM: CT HEAD WITHOUT CONTRAST TECHNIQUE: Contiguous axial images were obtained from the base of the skull through the vertex without intravenous contrast. COMPARISON:  None. FINDINGS: Brain: Prior cold occipital infarct on the right. There is atrophy and chronic small vessel disease changes. No  acute intracranial abnormality. Specifically, no hemorrhage, hydrocephalus, mass lesion, acute infarction, or significant intracranial injury. Vascular: No hyperdense vessel or unexpected calcification. Skull: No acute calvarial abnormality. Sinuses/Orbits: Visualized paranasal sinuses and mastoids clear. Orbital soft tissues unremarkable. Other: None IMPRESSION: No acute intracranial abnormality. Atrophy, chronic microvascular disease. Old right occipital infarct. Electronically Signed   By: Rolm Baptise M.D.   On: 11/04/2016 11:10   Dg Chest Portable 1 View  Result Date: 11/04/2016 CLINICAL DATA:  Altered mental status. EXAM: PORTABLE CHEST 1 VIEW COMPARISON:  Chest x-ray dated Sep 07, 2016. FINDINGS: Stable cardiomegaly. Normal pulmonary vascularity. Mild increased interstitial prominence is similar to prior study. Bibasilar atelectasis. No focal consolidation, pleural effusion, or pneumothorax. No acute osseous abnormality. IMPRESSION: No active cardiopulmonary disease.  Stable mild cardiomegaly. Electronically Signed   By: Titus Dubin M.D.   On: 11/04/2016 11:05

## 2016-11-04 NOTE — ED Notes (Signed)
Pt appears fatigued. Pt sleeping when this RN entered, awakes to voice. Pt answers questions, speech slurred some but understandable. No respiratory distress noted. able to move extremities on own when asked. Follows commands well. Family with pt at bedside.

## 2016-11-04 NOTE — ED Notes (Signed)
Pt given turkey sandwich and diet coke  

## 2016-11-04 NOTE — ED Notes (Signed)
Daughters states UTI last week, UTI "usually makes pt loopy" per daughter. Informed that doctor has ordered urine sample.

## 2016-11-04 NOTE — ED Triage Notes (Signed)
Pt via ems from [facility] with weakness; daughter states that he has had some trouble with his words this morning; hx of dementia. Pt alert, answers yes or no questions sometimes.

## 2016-11-04 NOTE — ED Notes (Signed)
Pt given cranberry juice.

## 2016-11-04 NOTE — ED Provider Notes (Signed)
[facility] Emergency Department Provider Note  ____________________________________________  Time seen: Approximately 3:26 PM  I have reviewed the triage vital signs and the nursing notes.   HISTORY  Chief Complaint Weakness  Level 5 caveat:  Portions of the history and physical were unable to be obtained due to: Altered mental status due to chronic dementia   HPI Randall Boyd is a 83 y.o. male brought to the ED from nursing home due to decreased energy today, and only eating half of his breakfast. No falls. Patient denies any pain or headache or lateralizing paresthesia or weakness.     Past Medical History:  Diagnosis Date  . Anxiety   . Depression   . DM (diabetes mellitus) ([place])   . Hypertension   . Hypothyroidism   . Skin cancer      Patient Active Problem List   Diagnosis Date Noted  . Confusion 05/12/2016     Past Surgical History:  Procedure Laterality Date  . SKIN CANCER EXCISION       Prior to Admission medications   Medication Sig Start Date End Date Taking? Authorizing Provider  acetaminophen (TYLENOL) 325 MG tablet Take 650 mg by mouth every 6 (six) hours as needed.   Yes [provider]  amLODipine (NORVASC) 10 MG tablet Take 10 mg by mouth daily. 03/08/16  Yes [provider]  atorvastatin (LIPITOR) 20 MG tablet Take 20 mg by mouth daily.   Yes [provider]  ciprofloxacin (CIPRO) 250 MG tablet Take 250 mg by mouth 2 (two) times daily. 10/29/16 11/05/16 Yes [provider]  citalopram (CELEXA) 40 MG tablet Take 40 mg by mouth daily. 03/08/16  Yes [provider]  donepezil (ARICEPT) 10 MG tablet Take 10 mg by mouth at bedtime.  02/28/16  Yes [provider]  finasteride (PROSCAR) 5 MG tablet Take 5 mg by mouth daily. 03/08/16  Yes [provider]  furosemide (LASIX) 20 MG tablet Take 20 mg by mouth daily.  04/19/16  Yes [provider]  guaiFENesin  (DIABETIC TUSSIN EX) 100 MG/5ML liquid Take 200 mg by mouth 3 (three) times daily as needed for cough.   Yes [provider]  insulin lispro (HUMALOG) 100 UNIT/ML injection Inject 0-10 Units into the skin 3 (three) times daily before meals. BS <80, give 0 units BS 81-125, give 4 units BS 126-200, give 8 units BS >201, give 10 units   Yes [provider]  LANTUS SOLOSTAR 100 UNIT/ML Solostar Pen Inject 25 Units into the muscle daily.  04/08/16  Yes [provider]  levothyroxine (SYNTHROID, LEVOTHROID) 100 MCG tablet Take 100 mcg by mouth daily. 02/08/16  Yes [provider]  lisinopril (PRINIVIL,ZESTRIL) 40 MG tablet Take 40 mg by mouth daily. 03/06/16  Yes [provider]  loratadine (CLARITIN) 10 MG tablet Take 10 mg by mouth daily as needed for allergies.   Yes [provider]  LORazepam (ATIVAN) 0.5 MG tablet Take 0.5-1 mg by mouth 2 (two) times daily. 1 tab qam and 2 tabs qpm 03/23/16  Yes [provider]  metFORMIN (GLUCOPHAGE) 1000 MG tablet Take 1,000 mg by mouth 2 (two) times daily. 04/09/16  Yes [provider]  metoprolol tartrate (LOPRESSOR) 25 MG tablet Take 25 mg by mouth daily.    Yes [provider]  potassium chloride (K-DUR) 10 MEQ tablet Take 10 mEq by mouth daily.   Yes [provider]  ranitidine (ZANTAC) 150 MG tablet Take 150 mg  by mouth 2 (two) times daily. 02/28/16  Yes [provider]  risperiDONE (RISPERDAL) 0.25 MG tablet Take 0.25 mg by mouth daily.  04/10/16  Yes [provider]  saccharomyces boulardii (FLORASTOR) 250 MG capsule Take 250 mg by mouth daily.   Yes [provider]  tamsulosin (FLOMAX) 0.4 MG CAPS capsule Take 0.4 mg by mouth every evening.  03/08/16  Yes [provider]  terazosin (HYTRIN) 5 MG capsule Take 5 mg by mouth every evening.  03/08/16  Yes [provider]  traMADol (ULTRAM) 50 MG tablet Take by mouth 2 (two)  times daily.   Yes [provider]     Allergies Patient has no known allergies.   No family history on file.  Social History Social History  Substance Use Topics  . Smoking status: Former Research scientist (life sciences)  . Smokeless tobacco: Never Used  . Alcohol use No    Review of Systems  Constitutional:   No fever or chills.  ENT:   No sore throat. No rhinorrhea. Cardiovascular:   No chest pain or syncope. Respiratory:   No dyspnea or cough. Gastrointestinal:   Negative for abdominal pain, vomiting and diarrhea.  Musculoskeletal:   Negative for focal pain or swelling All other systems reviewed and are negative except as documented above in ROS and HPI.  ____________________________________________   PHYSICAL EXAM:  VITAL SIGNS: ED Triage Vitals  Enc Vitals Group     BP 11/04/16 1053 (!) 109/56     Pulse Rate 11/04/16 1053 (!) 52     Resp 11/04/16 1053 12     Temp 11/04/16 1053 98.3 F (36.8 C)     Temp Source 11/04/16 1053 Oral     SpO2 11/04/16 1053 95 %     Weight 11/04/16 1054 190 lb (86.2 kg)     Height 11/04/16 1054 6' (1.829 m)     Head Circumference --      Peak Flow --      Pain Score --      Pain Loc --      Pain Edu? --      Excl. in [place]? --     Vital signs reviewed, nursing assessments reviewed.   Constitutional:   Alert and oriented To person. Well appearing and in no distress. Eyes:   No scleral icterus.  EOMI. No nystagmus. No conjunctival pallor. PERRL. ENT   Head:   Normocephalic and atraumatic.   Nose:   No congestion/rhinnorhea.    Mouth/Throat:   Dry mucous members, no pharyngeal erythema. No peritonsillar mass.    Neck:   No meningismus. Full ROM Hematological/Lymphatic/Immunilogical:   No cervical lymphadenopathy. Cardiovascular:   RRR. Symmetric bilateral radial and DP pulses.  No murmurs.  Respiratory:   Normal respiratory effort without tachypnea/retractions. Breath sounds are clear and equal bilaterally. No  wheezes/rales/rhonchi. Gastrointestinal:   Soft and nontender. Non distended. There is no CVA tenderness.  No rebound, rigidity, or guarding. Genitourinary:   deferred Musculoskeletal:   Normal range of motion in all extremities. No joint effusions.  No lower extremity tenderness.  No edema. Neurologic:   Normal speech , limited language consistent with dementia.  Motor grossly intact. No gross acute focal neurologic deficits are appreciated.  Skin:    Skin is warm, dry and intact. No rash noted.  No petechiae, purpura, or bullae.  ____________________________________________    LABS (pertinent positives/negatives) (all labs ordered are listed, but only abnormal results are displayed) Labs Reviewed  CBC WITH DIFFERENTIAL/PLATELET -  Abnormal; Notable for the following:       Result Value   RBC 3.80 (*)    Hemoglobin 11.4 (*)    HCT 33.6 (*)    RDW 16.3 (*)    Neutro Abs 7.0 (*)    Monocytes Absolute 1.2 (*)    All other components within normal limits  URINALYSIS, COMPLETE (UACMP) WITH MICROSCOPIC - Abnormal; Notable for the following:    Color, Urine YELLOW (*)    APPearance CLEAR (*)    Hgb urine dipstick SMALL (*)    Squamous Epithelial / LPF 0-5 (*)    All other components within normal limits  COMPREHENSIVE METABOLIC PANEL - Abnormal; Notable for the following:    Glucose, Bld 61 (*)    BUN 23 (*)    Creatinine, Ser 1.65 (*)    Calcium 8.8 (*)    Albumin 2.9 (*)    ALT 8 (*)    GFR calc non Af Amer 37 (*)    GFR calc Af Amer 42 (*)    All other components within normal limits  GLUCOSE, CAPILLARY - Abnormal; Notable for the following:    Glucose-Capillary 101 (*)    All other components within normal limits  LIPASE, BLOOD   ____________________________________________   EKG  Interpreted by me Sinus rhythm rate of 53, normal axis and intervals. Normal QRS ST segments and T waves.  ____________________________________________    RADIOLOGY  Ct Head Wo  Contrast  Result Date: 11/04/2016 CLINICAL DATA:  Generalized weakness, dementia EXAM: CT HEAD WITHOUT CONTRAST TECHNIQUE: Contiguous axial images were obtained from the base of the skull through the vertex without intravenous contrast. COMPARISON:  None. FINDINGS: Brain: Prior cold occipital infarct on the right. There is atrophy and chronic small vessel disease changes. No acute intracranial abnormality. Specifically, no hemorrhage, hydrocephalus, mass lesion, acute infarction, or significant intracranial injury. Vascular: No hyperdense vessel or unexpected calcification. Skull: No acute calvarial abnormality. Sinuses/Orbits: Visualized paranasal sinuses and mastoids clear. Orbital soft tissues unremarkable. Other: None IMPRESSION: No acute intracranial abnormality. Atrophy, chronic microvascular disease. Old right occipital infarct. Electronically Signed   By: Rolm Baptise M.D.   On: 11/04/2016 11:10   Dg Chest Portable 1 View  Result Date: 11/04/2016 CLINICAL DATA:  Altered mental status. EXAM: PORTABLE CHEST 1 VIEW COMPARISON:  Chest x-ray dated Sep 07, 2016. FINDINGS: Stable cardiomegaly. Normal pulmonary vascularity. Mild increased interstitial prominence is similar to prior study. Bibasilar atelectasis. No focal consolidation, pleural effusion, or pneumothorax. No acute osseous abnormality. IMPRESSION: No active cardiopulmonary disease.  Stable mild cardiomegaly. Electronically Signed   By: Titus Dubin M.D.   On: 11/04/2016 11:05    ____________________________________________   PROCEDURES Procedures  ____________________________________________   INITIAL IMPRESSION / ASSESSMENT AND PLAN / ED COURSE  Pertinent labs & imaging results that were available during my care of the patient were reviewed by me and considered in my medical decision making (see chart for details).  Patient well appearing no acute distress, check labs and CT head and chest x-ray which were all unremarkable. On  reassessment at 3:00 PM patient is feeling much better. He ate a full sandwich tray by himself here in the ED. We'll discharge home to follow up with primary care. He is scheduled to take risperidone, tramadol, and Ativan at 8:00 AM on his medication list, and this may have contributed to today's symptoms. The ciprofloxacin may be causing some mild muscle weakness as well, but I have low suspicion for severe myositis or  rhabdomyolysis. No evidence of serious soft tissue issues related to the Cipro use.  Patient appears be back to baseline at 3:00 PM. Low suspicion for ACS PE dissection AAA Pathology pancreatitis obstruction perforation or appendicitis. Low suspicion for mesenteric ischemia. No evidence of stroke or meningitis encephalitis and pneumonia or UTI. Patient is not septic.      ____________________________________________   FINAL CLINICAL IMPRESSION(S) / ED DIAGNOSES  Final diagnoses:  Malaise and fatigue      New Prescriptions   No medications on file     Portions of this note were generated with dragon dictation software. Dictation errors may occur despite best attempts at proofreading.    Carrie Mew, MD 11/04/16 [phone]

## 2017-04-02 ENCOUNTER — Other Ambulatory Visit: Payer: Self-pay

## 2017-04-02 ENCOUNTER — Encounter: Payer: Self-pay | Admitting: Emergency Medicine

## 2017-04-02 ENCOUNTER — Emergency Department: Payer: Medicare Other

## 2017-04-02 ENCOUNTER — Inpatient Hospital Stay
Admission: EM | Admit: 2017-04-02 | Discharge: 2017-04-07 | DRG: 871 | Disposition: A | Discharge status: Skilled Nursing Facility | Payer: Medicare Other | Attending: Internal Medicine | Admitting: Internal Medicine

## 2017-04-02 DIAGNOSIS — Z66 Do not resuscitate: Secondary | ICD-10-CM | POA: Diagnosis present

## 2017-04-02 DIAGNOSIS — R7989 Other specified abnormal findings of blood chemistry: Secondary | ICD-10-CM

## 2017-04-02 DIAGNOSIS — Z87891 Personal history of nicotine dependence: Secondary | ICD-10-CM

## 2017-04-02 DIAGNOSIS — E1122 Type 2 diabetes mellitus with diabetic chronic kidney disease: Secondary | ICD-10-CM | POA: Diagnosis present

## 2017-04-02 DIAGNOSIS — I248 Other forms of acute ischemic heart disease: Secondary | ICD-10-CM | POA: Diagnosis present

## 2017-04-02 DIAGNOSIS — F329 Major depressive disorder, single episode, unspecified: Secondary | ICD-10-CM | POA: Diagnosis present

## 2017-04-02 DIAGNOSIS — E039 Hypothyroidism, unspecified: Secondary | ICD-10-CM | POA: Diagnosis present

## 2017-04-02 DIAGNOSIS — Z794 Long term (current) use of insulin: Secondary | ICD-10-CM

## 2017-04-02 DIAGNOSIS — I509 Heart failure, unspecified: Secondary | ICD-10-CM | POA: Diagnosis present

## 2017-04-02 DIAGNOSIS — J690 Pneumonitis due to inhalation of food and vomit: Secondary | ICD-10-CM | POA: Diagnosis not present

## 2017-04-02 DIAGNOSIS — F0390 Unspecified dementia without behavioral disturbance: Secondary | ICD-10-CM | POA: Diagnosis present

## 2017-04-02 DIAGNOSIS — I130 Hypertensive heart and chronic kidney disease with heart failure and stage 1 through stage 4 chronic kidney disease, or unspecified chronic kidney disease: Secondary | ICD-10-CM | POA: Diagnosis present

## 2017-04-02 DIAGNOSIS — R0600 Dyspnea, unspecified: Secondary | ICD-10-CM

## 2017-04-02 DIAGNOSIS — A4151 Sepsis due to Escherichia coli [E. coli]: Principal | ICD-10-CM | POA: Diagnosis present

## 2017-04-02 DIAGNOSIS — Z79899 Other long term (current) drug therapy: Secondary | ICD-10-CM

## 2017-04-02 DIAGNOSIS — R001 Bradycardia, unspecified: Secondary | ICD-10-CM | POA: Diagnosis not present

## 2017-04-02 DIAGNOSIS — Z85828 Personal history of other malignant neoplasm of skin: Secondary | ICD-10-CM

## 2017-04-02 DIAGNOSIS — N390 Urinary tract infection, site not specified: Secondary | ICD-10-CM | POA: Diagnosis present

## 2017-04-02 DIAGNOSIS — R778 Other specified abnormalities of plasma proteins: Secondary | ICD-10-CM | POA: Diagnosis present

## 2017-04-02 DIAGNOSIS — N183 Chronic kidney disease, stage 3 (moderate): Secondary | ICD-10-CM | POA: Diagnosis present

## 2017-04-02 DIAGNOSIS — F419 Anxiety disorder, unspecified: Secondary | ICD-10-CM | POA: Diagnosis present

## 2017-04-02 DIAGNOSIS — G9341 Metabolic encephalopathy: Secondary | ICD-10-CM | POA: Diagnosis present

## 2017-04-02 LAB — CBC WITH DIFFERENTIAL/PLATELET
BASOS PCT: 1 %
Basophils Absolute: 0.1 10*3/uL (ref 0–0.1)
EOS ABS: 0 10*3/uL (ref 0–0.7)
EOS PCT: 0 %
HCT: 31.6 % — ABNORMAL LOW (ref 40.0–52.0)
HEMOGLOBIN: 10.1 g/dL — AB (ref 13.0–18.0)
Lymphocytes Relative: 15 %
Lymphs Abs: 2.1 10*3/uL (ref 1.0–3.6)
MCH: 28.3 pg (ref 26.0–34.0)
MCHC: 32 g/dL (ref 32.0–36.0)
MCV: 88.5 fL (ref 80.0–100.0)
Monocytes Absolute: 0.7 10*3/uL (ref 0.2–1.0)
Monocytes Relative: 5 %
NEUTROS PCT: 79 %
Neutro Abs: 11.2 10*3/uL — ABNORMAL HIGH (ref 1.4–6.5)
PLATELETS: 448 10*3/uL — AB (ref 150–440)
RBC: 3.58 MIL/uL — AB (ref 4.40–5.90)
RDW: 15.6 % — ABNORMAL HIGH (ref 11.5–14.5)
WBC: 14.1 10*3/uL — AB (ref 3.8–10.6)

## 2017-04-02 LAB — COMPREHENSIVE METABOLIC PANEL
ALT: 8 U/L — ABNORMAL LOW (ref 17–63)
ANION GAP: 10 (ref 5–15)
AST: 17 U/L (ref 15–41)
Albumin: 2.6 g/dL — ABNORMAL LOW (ref 3.5–5.0)
Alkaline Phosphatase: 72 U/L (ref 38–126)
BILIRUBIN TOTAL: 0.5 mg/dL (ref 0.3–1.2)
BUN: 24 mg/dL — ABNORMAL HIGH (ref 6–20)
CHLORIDE: 101 mmol/L (ref 101–111)
CO2: 25 mmol/L (ref 22–32)
Calcium: 9 mg/dL (ref 8.9–10.3)
Creatinine, Ser: 1.37 mg/dL — ABNORMAL HIGH (ref 0.61–1.24)
GFR, EST AFRICAN AMERICAN: 53 mL/min — AB (ref >60)
GFR, EST NON AFRICAN AMERICAN: 46 mL/min — AB (ref >60)
Glucose, Bld: 105 mg/dL — ABNORMAL HIGH (ref 65–99)
POTASSIUM: 3.6 mmol/L (ref 3.5–5.1)
Sodium: 136 mmol/L (ref 135–145)
TOTAL PROTEIN: 6.8 g/dL (ref 6.5–8.1)

## 2017-04-02 LAB — TROPONIN I
TROPONIN I: 0.07 ng/mL — AB (ref <0.03)
TROPONIN I: 0.06 ng/mL — AB (ref <0.03)
TROPONIN I: 0.04 ng/mL — AB (ref <0.03)
Troponin I: 0.04 ng/mL (ref <0.03)
Troponin I: 0.04 ng/mL (ref <0.03)

## 2017-04-02 LAB — MRSA PCR SCREENING: MRSA BY PCR: NEGATIVE

## 2017-04-02 LAB — GLUCOSE, CAPILLARY
GLUCOSE-CAPILLARY: 166 mg/dL — AB (ref 65–99)
GLUCOSE-CAPILLARY: 180 mg/dL — AB (ref 65–99)

## 2017-04-02 LAB — INFLUENZA PANEL BY PCR (TYPE A & B)
INFLAPCR: NEGATIVE
Influenza B By PCR: NEGATIVE

## 2017-04-02 LAB — LACTIC ACID, PLASMA: Lactic Acid, Venous: 1.4 mmol/L (ref 0.5–1.9)

## 2017-04-02 LAB — BRAIN NATRIURETIC PEPTIDE: B NATRIURETIC PEPTIDE 5: 330 pg/mL — AB (ref 0.0–100.0)

## 2017-04-02 MED ORDER — FINASTERIDE 5 MG PO TABS
5.0000 mg | ORAL_TABLET | Freq: Every day | ORAL | Status: DC
  Administered 2017-04-03 – 2017-04-07 (×5): 5 mg via ORAL
  Filled 2017-04-02 (×5): qty 1

## 2017-04-02 MED ORDER — RISPERIDONE 0.25 MG PO TABS
0.2500 mg | ORAL_TABLET | Freq: Every day | ORAL | Status: DC
  Administered 2017-04-02 – 2017-04-06 (×5): 0.25 mg via ORAL
  Filled 2017-04-02 (×6): qty 1

## 2017-04-02 MED ORDER — SACCHAROMYCES BOULARDII 250 MG PO CAPS
250.0000 mg | ORAL_CAPSULE | Freq: Every day | ORAL | Status: DC
  Administered 2017-04-03 – 2017-04-07 (×5): 250 mg via ORAL
  Filled 2017-04-02 (×5): qty 1

## 2017-04-02 MED ORDER — ONDANSETRON HCL 4 MG/2ML IJ SOLN
4.0000 mg | Freq: Once | INTRAMUSCULAR | Status: AC
  Administered 2017-04-02: 4 mg via INTRAVENOUS

## 2017-04-02 MED ORDER — INSULIN ASPART 100 UNIT/ML ~~LOC~~ SOLN
0.0000 [IU] | Freq: Every day | SUBCUTANEOUS | Status: DC
  Administered 2017-04-03: 3 [IU] via SUBCUTANEOUS
  Administered 2017-04-04: 4 [IU] via SUBCUTANEOUS
  Filled 2017-04-02 (×2): qty 1

## 2017-04-02 MED ORDER — GI COCKTAIL ~~LOC~~
30.0000 mL | Freq: Four times a day (QID) | ORAL | Status: DC | PRN
  Filled 2017-04-02: qty 30

## 2017-04-02 MED ORDER — LORATADINE 10 MG PO TABS: 10.0000 mg | ORAL_TABLET | Freq: Every day | ORAL | Status: DC | PRN

## 2017-04-02 MED ORDER — GUAIFENESIN 100 MG/5ML PO SOLN
200.0000 mg | Freq: Three times a day (TID) | ORAL | Status: DC | PRN
  Filled 2017-04-02: qty 10

## 2017-04-02 MED ORDER — ONDANSETRON HCL 4 MG/2ML IJ SOLN: 4.0000 mg | Freq: Four times a day (QID) | INTRAMUSCULAR | Status: DC | PRN

## 2017-04-02 MED ORDER — SODIUM CHLORIDE 0.9 % IV SOLN
INTRAVENOUS | Status: AC
  Administered 2017-04-02 – 2017-04-03 (×2): via INTRAVENOUS

## 2017-04-02 MED ORDER — ACETAMINOPHEN 325 MG PO TABS
650.0000 mg | ORAL_TABLET | Freq: Four times a day (QID) | ORAL | Status: DC | PRN
  Administered 2017-04-06: 650 mg via ORAL
  Filled 2017-04-02: qty 2

## 2017-04-02 MED ORDER — AMLODIPINE BESYLATE 10 MG PO TABS
10.0000 mg | ORAL_TABLET | Freq: Every day | ORAL | Status: DC
  Administered 2017-04-03 – 2017-04-06 (×4): 10 mg via ORAL
  Filled 2017-04-02 (×4): qty 1

## 2017-04-02 MED ORDER — TERAZOSIN HCL 5 MG PO CAPS
5.0000 mg | ORAL_CAPSULE | Freq: Every evening | ORAL | Status: DC
  Administered 2017-04-02 – 2017-04-06 (×5): 5 mg via ORAL
  Filled 2017-04-02 (×6): qty 1

## 2017-04-02 MED ORDER — CITALOPRAM HYDROBROMIDE 20 MG PO TABS
40.0000 mg | ORAL_TABLET | Freq: Every day | ORAL | Status: DC
  Administered 2017-04-03 – 2017-04-06 (×4): 40 mg via ORAL
  Filled 2017-04-02 (×4): qty 2

## 2017-04-02 MED ORDER — LEVOTHYROXINE SODIUM 100 MCG PO TABS
100.0000 ug | ORAL_TABLET | Freq: Every day | ORAL | Status: DC
  Administered 2017-04-03 – 2017-04-07 (×5): 100 ug via ORAL
  Filled 2017-04-02 (×5): qty 1

## 2017-04-02 MED ORDER — ONDANSETRON HCL 4 MG/2ML IJ SOLN
INTRAMUSCULAR | Status: AC
  Filled 2017-04-02: qty 2

## 2017-04-02 MED ORDER — LISINOPRIL 20 MG PO TABS
40.0000 mg | ORAL_TABLET | Freq: Every day | ORAL | Status: DC
  Administered 2017-04-03 – 2017-04-06 (×4): 40 mg via ORAL
  Filled 2017-04-02 (×4): qty 2

## 2017-04-02 MED ORDER — POTASSIUM CHLORIDE CRYS ER 10 MEQ PO TBCR
10.0000 meq | EXTENDED_RELEASE_TABLET | Freq: Every day | ORAL | Status: DC
  Administered 2017-04-03 – 2017-04-06 (×4): 10 meq via ORAL
  Filled 2017-04-02 (×4): qty 1

## 2017-04-02 MED ORDER — LORAZEPAM 0.5 MG PO TABS
0.5000 mg | ORAL_TABLET | Freq: Two times a day (BID) | ORAL | Status: DC | PRN
  Administered 2017-04-05: 0.5 mg via ORAL
  Filled 2017-04-02: qty 1

## 2017-04-02 MED ORDER — INSULIN ASPART 100 UNIT/ML ~~LOC~~ SOLN
0.0000 [IU] | Freq: Three times a day (TID) | SUBCUTANEOUS | Status: DC
  Administered 2017-04-02: 2 [IU] via SUBCUTANEOUS
  Administered 2017-04-03: 3 [IU] via SUBCUTANEOUS
  Administered 2017-04-03: 5 [IU] via SUBCUTANEOUS
  Administered 2017-04-03: 3 [IU] via SUBCUTANEOUS
  Administered 2017-04-04 (×2): 5 [IU] via SUBCUTANEOUS
  Administered 2017-04-04: 2 [IU] via SUBCUTANEOUS
  Administered 2017-04-05: 3 [IU] via SUBCUTANEOUS
  Administered 2017-04-05 – 2017-04-07 (×6): 2 [IU] via SUBCUTANEOUS
  Filled 2017-04-02 (×14): qty 1

## 2017-04-02 MED ORDER — ATORVASTATIN CALCIUM 20 MG PO TABS
20.0000 mg | ORAL_TABLET | Freq: Every day | ORAL | Status: DC
  Administered 2017-04-02 – 2017-04-06 (×5): 20 mg via ORAL
  Filled 2017-04-02 (×5): qty 1

## 2017-04-02 MED ORDER — TAMSULOSIN HCL 0.4 MG PO CAPS
0.4000 mg | ORAL_CAPSULE | Freq: Every evening | ORAL | Status: DC
  Administered 2017-04-02 – 2017-04-06 (×5): 0.4 mg via ORAL
  Filled 2017-04-02 (×5): qty 1

## 2017-04-02 MED ORDER — PSYLLIUM 95 % PO PACK
1.0000 | PACK | Freq: Every day | ORAL | Status: DC
  Administered 2017-04-03 – 2017-04-07 (×3): 1 via ORAL
  Filled 2017-04-02 (×5): qty 1

## 2017-04-02 MED ORDER — FAMOTIDINE 20 MG PO TABS
20.0000 mg | ORAL_TABLET | Freq: Two times a day (BID) | ORAL | Status: DC
  Administered 2017-04-02 – 2017-04-07 (×10): 20 mg via ORAL
  Filled 2017-04-02 (×10): qty 1

## 2017-04-02 MED ORDER — ENOXAPARIN SODIUM 40 MG/0.4ML ~~LOC~~ SOLN
40.0000 mg | SUBCUTANEOUS | Status: DC
Start: 2017-04-02 — End: 2017-04-07
  Administered 2017-04-02 – 2017-04-06 (×5): 40 mg via SUBCUTANEOUS
  Filled 2017-04-02 (×5): qty 0.4

## 2017-04-02 MED ORDER — DONEPEZIL HCL 5 MG PO TABS
10.0000 mg | ORAL_TABLET | Freq: Every day | ORAL | Status: DC
  Administered 2017-04-02 – 2017-04-06 (×5): 10 mg via ORAL
  Filled 2017-04-02 (×5): qty 2

## 2017-04-02 NOTE — ED Provider Notes (Signed)
[facility] Emergency Department Provider Note   ____________________________________________   First MD Initiated Contact with Patient 04/02/17 [phone]     (approximate)  I have reviewed the triage vital signs and the nursing notes.   HISTORY  Chief Complaint Shortness of Breath and Cough   HPI Randall Boyd is a 83 y.o. male Patient reports she had upset stomach this morning was vomiting some he also has been coughing up thick phlegm. O2 sats were reported to be 89 on room air by EMS without 94 with 2 L. He's been afebrile. He says he feels a little bit short of breath and his stomach is been upset.he says the symptoms started today.  Past Medical History:  Diagnosis Date  . Anxiety   . Depression   . DM (diabetes mellitus) ([place])   . Hypertension   . Hypothyroidism   . Skin cancer     Patient Active Problem List   Diagnosis Date Noted  . Confusion 05/12/2016    Past Surgical History:  Procedure Laterality Date  . SKIN CANCER EXCISION      Prior to Admission medications   Medication Sig Start Date End Date Taking? Authorizing Provider  acetaminophen (TYLENOL) 325 MG tablet Take 650 mg by mouth every 6 (six) hours as needed.   Yes [provider]  amLODipine (NORVASC) 5 MG tablet Take 10 mg by mouth daily. 03/08/16  Yes [provider]  atorvastatin (LIPITOR) 20 MG tablet Take 20 mg by mouth daily.   Yes [provider]  citalopram (CELEXA) 40 MG tablet Take 40 mg by mouth daily. 03/08/16  Yes [provider]  donepezil (ARICEPT) 10 MG tablet Take 10 mg by mouth at bedtime.  02/28/16  Yes [provider]  finasteride (PROSCAR) 5 MG tablet Take 5 mg by mouth daily. 03/08/16  Yes [provider]  furosemide (LASIX) 20 MG tablet Take 40 mg by mouth daily.  04/19/16  Yes [provider]  insulin lispro (HUMALOG) 100 UNIT/ML injection Inject 0-10 Units into the skin 3 (three) times daily  before meals. BS <80, give 0 units BS 81-125, give 4 units BS 126-200, give 8 units BS >201, give 10 units   Yes [provider]  LANTUS SOLOSTAR 100 UNIT/ML Solostar Pen Inject 25 Units into the muscle daily.  04/08/16  Yes [provider]  levothyroxine (SYNTHROID, LEVOTHROID) 100 MCG tablet Take 100 mcg by mouth daily. 02/08/16  Yes [provider]  lisinopril (PRINIVIL,ZESTRIL) 40 MG tablet Take 40 mg by mouth daily. 03/06/16  Yes [provider]  metFORMIN (GLUCOPHAGE) 1000 MG tablet Take 1,000 mg by mouth 2 (two) times daily. 04/09/16  Yes [provider]  metoprolol tartrate (LOPRESSOR) 25 MG tablet Take 25 mg by mouth daily.    Yes [provider]  potassium chloride (K-DUR) 10 MEQ tablet Take 10 mEq by mouth daily.   Yes [provider]  psyllium (REGULOID) 0.52 g capsule Take 0.52 g by mouth daily.   Yes [provider]  ranitidine (ZANTAC) 150 MG tablet Take 150 mg by mouth 2 (two) times daily. 02/28/16  Yes [provider]  risperiDONE (RISPERDAL) 0.25 MG tablet Take 0.25 mg by mouth daily.  04/10/16  Yes [provider]  saccharomyces boulardii (FLORASTOR) 250 MG capsule Take 250 mg by mouth daily.   Yes [provider]  tamsulosin (FLOMAX) 0.4 MG CAPS capsule Take 0.4 mg by mouth every evening.  03/08/16  Yes  [provider]  terazosin (HYTRIN) 5 MG capsule Take 5 mg by mouth every evening.  03/08/16  Yes [provider]  guaiFENesin (DIABETIC TUSSIN EX) 100 MG/5ML liquid Take 200 mg by mouth 3 (three) times daily as needed for cough.    [provider]  loratadine (CLARITIN) 10 MG tablet Take 10 mg by mouth daily as needed for allergies.    [provider]  LORazepam (ATIVAN) 0.5 MG tablet Take 0.5-1 mg by mouth 2 (two) times daily. 1 tab qam and 2 tabs qpm 03/23/16   [provider]    Allergies Patient has no known allergies.  No  family history on file.  Social History Social History   Tobacco Use  . Smoking status: Former Research scientist (life sciences)  . Smokeless tobacco: Never Used  Substance Use Topics  . Alcohol use: No  . Drug use: No    Review of Systems  Constitutional: No fever/chills Eyes: No visual changes. ENT: No sore throat. Cardiovascular: Denies chest pain. Respiratory:  shortness of breath. Gastrointestinal: No abdominal pain.  No nausea,  vomiting.  No diarrhea.  No constipation. Genitourinary: Negative for dysuria. Musculoskeletal: Negative for back pain. Skin: Negative for rash. Neurological: Negative for headaches, focal weakness  ____________________________________________   PHYSICAL EXAM:  VITAL SIGNS: ED Triage Vitals  Enc Vitals Group     BP 04/02/17 0843 (!) 143/52     Pulse Rate 04/02/17 0843 (!) 54     Resp 04/02/17 0843 19     Temp 04/02/17 0843 97.7 F (36.5 C)     Temp Source 04/02/17 0843 Oral     SpO2 04/02/17 0843 90 %     Weight 04/02/17 0843 180 lb (81.6 kg)     Height 04/02/17 0843 6\' 1"  (1.854 m)     Head Circumference --      Peak Flow --      Pain Score 04/02/17 0841 0     Pain Loc --      Pain Edu? --      Excl. in [place]? --     Constitutional: Alert and oriented. Well appearing and in no acute distress. Eyes: Conjunctivae are normal.  Head: Atraumatic. Nose: No congestion/rhinnorhea. Mouth/Throat: Mucous membranes are moist.  Oropharynx non-erythematous. Neck: No stridor Cardiovascular: Normal rate, regular rhythm. Grossly normal heart sounds.  Good peripheral circulation. Respiratory: Normal respiratory effort.  No retractions. Lungs CTAB. Gastrointestinal: Soft and nontender. No distention. No abdominal bruits. No CVA tenderness. Musculoskeletal: No lower extremity tenderness 1+ edemabilaterally.  No joint effusions. Neurologic:  Normal speech and language. No gross focal neurologic deficits are appreciated. Skin:  Skin is warm, dry and intact. No rash  noted. Psychiatric: Mood and affect are normal. Speech and behavior are normal.  ____________________________________________   LABS (all labs ordered are listed, but only abnormal results are displayed)  Labs Reviewed  COMPREHENSIVE METABOLIC PANEL - Abnormal; Notable for the following components:      Result Value   Glucose, Bld 105 (*)    BUN 24 (*)    Creatinine, Ser 1.37 (*)    Albumin 2.6 (*)    ALT 8 (*)    GFR calc non Af Amer 46 (*)    GFR calc Af Amer 53 (*)    All other components within normal limits  BRAIN NATRIURETIC PEPTIDE - Abnormal; Notable for the following components:   B Natriuretic Peptide 330.0 (*)    All other components within normal limits  TROPONIN I - Abnormal;  Notable for the following components:   Troponin I 0.07 (*)    All other components within normal limits  CBC WITH DIFFERENTIAL/PLATELET - Abnormal; Notable for the following components:   WBC 14.1 (*)    RBC 3.58 (*)    Hemoglobin 10.1 (*)    HCT 31.6 (*)    RDW 15.6 (*)    Platelets 448 (*)    Neutro Abs 11.2 (*)    All other components within normal limits  TROPONIN I - Abnormal; Notable for the following components:   Troponin I 0.06 (*)    All other components within normal limits  LACTIC ACID, PLASMA   ____________________________________________  EKG  EKG read and interpreted by me shows sinus bradycardia rate of 59 left axis first degree AV block and no acute ST-T wave changes ____________________________________________  RADIOLOGY chest x-ray read as no acute disease ____________________________________________   PROCEDURES  Procedure(s) performed:   Procedures  Critical Care performed:   ____________________________________________   INITIAL IMPRESSION / ASSESSMENT AND PLAN / ED COURSE  daughter arrives and after waiting for quite some time for me to get caught up tells me that the patient had been vomiting yesterday he has a chronic cough may be a little  worse now and chronic runny nose. He is in assisted living. Patient's heart rate is been running in the high 40s for a good part of his stay here. His not having any chest pain no further vomiting after some Zofran   his BNP is somewhat elevated his O2 sats were down his white count is up and his troponin is elevated although the second troponin 3 hours later is slightly lower still in view of the previously mentioned abnormalities in fact his heart rate is now 46 and think the best thing to do would be to watch him   ____________________________________________   FINAL CLINICAL IMPRESSION(S) / ED DIAGNOSES  Final diagnoses:  Dyspnea, unspecified type  Elevated troponin  Elevated brain natriuretic peptide (BNP) level  Bradycardia     ED Discharge Orders    None       Note:  This document was prepared using Dragon voice recognition software and may include unintentional dictation errors.    Nena Polio, MD 04/02/17 [phone]

## 2017-04-02 NOTE — H&P (Signed)
[facility] NAME: Randall Boyd    MR#:  [id number]  DATE OF BIRTH:  09-Feb-1931  DATE OF ADMISSION:  04/02/2017  PRIMARY CARE PHYSICIAN: Velta Addison, Claretha Cooper, DO   REQUESTING/REFERRING PHYSICIAN: Conni Slipper  CHIEF COMPLAINT:   Nausea vomiting and cough  HISTORY OF PRESENT ILLNESS:  Randall Boyd  is a 83 y.o. male with a known history of diabetes mellitus, hypertension, hypothyroidism, anxiety and depression is brought into the ED from assisted living facility and being nauseous and vomiting couple of times last night. Patient's pulse ox was 89% on room air initially but on 2 L it went up to 94%. BNP is elevated with chest x-rays negative. Troponin is slightly elevated at 0.07. Patient is bradycardic but denies any dizziness. No chest pain either Daughter is at bedside.   PAST MEDICAL HISTORY:   Past Medical History:  Diagnosis Date  . Anxiety   . Depression   . DM (diabetes mellitus) ([place])   . Hypertension   . Hypothyroidism   . Skin cancer     PAST SURGICAL HISTOIRY:   Past Surgical History:  Procedure Laterality Date  . SKIN CANCER EXCISION      SOCIAL HISTORY:   Social History   Tobacco Use  . Smoking status: Former Research scientist (life sciences)  . Smokeless tobacco: Never Used  Substance Use Topics  . Alcohol use: No    FAMILY HISTORY:  Hypertension and diabetes mellitus runs in his family  DRUG ALLERGIES:  No Known Allergies  REVIEW OF SYSTEMS:  CONSTITUTIONAL: No fever, fatigue or weakness.  EYES: No blurred or double vision.  EARS, NOSE, AND THROAT: No tinnitus or ear pain.  RESPIRATORY: Some cough,  denies shortness of breath, wheezing or hemoptysis.  CARDIOVASCULAR: No chest pain, orthopnea, edema.  GASTROINTESTINAL: reprots  nausea, vomiting,  No diarrhea or abdominal pain.  GENITOURINARY: No dysuria, hematuria.  ENDOCRINE: No polyuria, nocturia,  HEMATOLOGY: No anemia, easy bruising or bleeding SKIN: No  rash or lesion. MUSCULOSKELETAL: No joint pain or arthritis.   NEUROLOGIC: No tingling, numbness, weakness.  PSYCHIATRY: No anxiety or depression.   MEDICATIONS AT HOME:   Prior to Admission medications   Medication Sig Start Date End Date Taking? Authorizing Provider  acetaminophen (TYLENOL) 325 MG tablet Take 650 mg by mouth every 6 (six) hours as needed.   Yes [provider]  amLODipine (NORVASC) 5 MG tablet Take 10 mg by mouth daily. 03/08/16  Yes [provider]  atorvastatin (LIPITOR) 20 MG tablet Take 20 mg by mouth daily.   Yes [provider]  citalopram (CELEXA) 40 MG tablet Take 40 mg by mouth daily. 03/08/16  Yes [provider]  donepezil (ARICEPT) 10 MG tablet Take 10 mg by mouth at bedtime.  02/28/16  Yes [provider]  finasteride (PROSCAR) 5 MG tablet Take 5 mg by mouth daily. 03/08/16  Yes [provider]  furosemide (LASIX) 20 MG tablet Take 40 mg by mouth daily.  04/19/16  Yes [provider]  insulin lispro (HUMALOG) 100 UNIT/ML injection Inject 0-10 Units into the skin 3 (three) times daily before meals. BS <80, give 0 units BS 81-125, give 4 units BS 126-200, give 8 units BS >201, give 10 units   Yes [provider]  LANTUS SOLOSTAR 100 UNIT/ML Solostar Pen Inject 25 Units into the muscle daily.  04/08/16  Yes [provider]  levothyroxine (SYNTHROID, LEVOTHROID) 100 MCG tablet Take 100 mcg  by mouth daily. 02/08/16  Yes [provider]  lisinopril (PRINIVIL,ZESTRIL) 40 MG tablet Take 40 mg by mouth daily. 03/06/16  Yes [provider]  metFORMIN (GLUCOPHAGE) 1000 MG tablet Take 1,000 mg by mouth 2 (two) times daily. 04/09/16  Yes [provider]  metoprolol tartrate (LOPRESSOR) 25 MG tablet Take 25 mg by mouth daily.    Yes [provider]  potassium chloride (K-DUR) 10 MEQ tablet Take 10 mEq by mouth daily.   Yes [provider]  psyllium  (REGULOID) 0.52 g capsule Take 0.52 g by mouth daily.   Yes [provider]  ranitidine (ZANTAC) 150 MG tablet Take 150 mg by mouth 2 (two) times daily. 02/28/16  Yes [provider]  risperiDONE (RISPERDAL) 0.25 MG tablet Take 0.25 mg by mouth daily.  04/10/16  Yes [provider]  saccharomyces boulardii (FLORASTOR) 250 MG capsule Take 250 mg by mouth daily.   Yes [provider]  tamsulosin (FLOMAX) 0.4 MG CAPS capsule Take 0.4 mg by mouth every evening.  03/08/16  Yes [provider]  terazosin (HYTRIN) 5 MG capsule Take 5 mg by mouth every evening.  03/08/16  Yes [provider]  guaiFENesin (DIABETIC TUSSIN EX) 100 MG/5ML liquid Take 200 mg by mouth 3 (three) times daily as needed for cough.    [provider]  loratadine (CLARITIN) 10 MG tablet Take 10 mg by mouth daily as needed for allergies.    [provider]  LORazepam (ATIVAN) 0.5 MG tablet Take 0.5-1 mg by mouth 2 (two) times daily. 1 tab qam and 2 tabs qpm 03/23/16   [provider]      VITAL SIGNS:  Blood pressure (!) 121/55, pulse (!) 49, temperature 97.7 F (36.5 C), temperature source Oral, resp. rate 15, height 6\' 1"  (1.854 m), weight 81.6 kg (180 lb), SpO2 98 %.  PHYSICAL EXAMINATION:  GENERAL:  83 y.o.-year-old patient lying in the bed with no acute distress.  EYES: Pupils equal, round, reactive to light and accommodation. No scleral icterus. Extraocular muscles intact.  HEENT: Head atraumatic, normocephalic. Oropharynx and nasopharynx clear.  NECK:  Supple, no jugular venous distention. No thyroid enlargement, no tenderness.  LUNGS: Normal breath sounds bilaterally, no wheezing, rales,rhonchi or crepitation. No use of accessory muscles of respiration.  CARDIOVASCULAR: S1, S2 normal. No murmurs, rubs, or gallops.  ABDOMEN: Soft, nontender, nondistended. Bowel sounds present. No organomegaly or mass.  EXTREMITIES: No pedal edema, cyanosis,  or clubbing.  NEUROLOGIC: Cranial nerves II through XII are intact. Muscle strength at his baseline in all extremities. Sensation intact. Gait not checked.  PSYCHIATRIC: The patient is alert and oriented x 3.  SKIN: No obvious rash, lesion, or ulcer.   LABORATORY PANEL:   CBC Recent Labs  Lab 04/02/17 0847  WBC 14.1*  HGB 10.1*  HCT 31.6*  PLT 448*   ------------------------------------------------------------------------------------------------------------------  Chemistries  Recent Labs  Lab 04/02/17 0847  NA 136  K 3.6  CL 101  CO2 25  GLUCOSE 105*  BUN 24*  CREATININE 1.37*  CALCIUM 9.0  AST 17  ALT 8*  ALKPHOS 72  BILITOT 0.5   ------------------------------------------------------------------------------------------------------------------  Cardiac Enzymes Recent Labs  Lab 04/02/17 1127  TROPONINI 0.06*   ------------------------------------------------------------------------------------------------------------------  RADIOLOGY:  Dg Chest Portable 1 View  Result Date: 04/02/2017 CLINICAL DATA:  Shortness of breath EXAM: PORTABLE CHEST 1 VIEW COMPARISON:  11/04/2016 FINDINGS: Chronic cardiomegaly with interstitial coarsening at the bases. Large lung volumes. There is no edema, consolidation,  effusion, or pneumothorax. IMPRESSION: No acute finding or change from priors. Electronically Signed   By: Monte Fantasia M.D.   On: 04/02/2017 09:31    EKG:   Orders placed or performed during the hospital encounter of 04/02/17  . ED EKG  . ED EKG  . EKG 12-Lead  . EKG 12-Lead    IMPRESSION AND PLAN:   Randall Boyd  is a 83 y.o. male with a known history of diabetes mellitus, hypertension, hypothyroidism, anxiety and depression is brought into the ED from assisted living facility and being nauseous and vomiting couple of times last night. Patient's pulse ox was 89% on room air initially but on 2 L it went up to 94%. BNP is elevated with chest x-rays  negative. Troponin is slightly elevated at 0.07.   # elevated troponin-could be from demand ischemia Telemetry monitoring Cycle cardiac biomarkers; first 2 troponins are 0.07 and 0.06. Will get echocardiogram Cardio consult  Chest x-rays negative but BNP is slightly elevated   #Sinus bradycardia-could be from dry heaving Will discontinue beta blocker metoprolol for now Cardio consult  Monitor patient on telemetry  #Nausea and vomiting Hydrate with IV fluids Hold Lasix   #insulin-dependent diabetes mellitus Currently patient is on clear liquid diet Hold Lantus and metformin   provide sliding scale insulin   #Essential hypertension Continue home medication terazosin, Norvasc and lisinopril Discontinued beta blocker metoprolol in view of sinus bradycardia at this time  GI prophylaxis with Pepcid DVT prophylaxis with Lovenox subcutaneous    All the records are reviewed and case discussed with ED provider. Management plans discussed with the patient, family and they are in agreement.  CODE STATUS:  DNR , daughter Eddie Candle HCPOA  TOTAL TIME TAKING CARE OF THIS PATIENT: 43  minutes.   Note: This dictation was prepared with Dragon dictation along with smaller phrase technology. Any transcriptional errors that result from this process are unintentional.  Nicholes Mango M.D on 04/02/2017 at 1:58 PM  Between 7am to 6pm - Pager - [phone]  After 6pm go to [email or web address] - password [email or web address] [facility]  Office  [phone]  CC: Primary care physician; Velta Addison, Claretha Cooper, DO

## 2017-04-02 NOTE — Progress Notes (Signed)
Anticoagulation monitoring(Lovenox):  83 yo  male ordered Lovenox 30 mg Q24h  Filed Weights   04/02/17 0843 04/02/17 1507  Weight: 180 lb (81.6 kg) 170 lb 8 oz (77.3 kg)   BMI    Lab Results  Component Value Date   CREATININE 1.37 (H) 04/02/2017   CREATININE 1.65 (H) 11/04/2016   CREATININE 1.79 (H) 09/07/2016   Estimated Creatinine Clearance: 43.9 mL/min (A) (by C-G formula based on SCr of 1.37 mg/dL (H)). Hemoglobin & Hematocrit     Component Value Date/Time   HGB 10.1 (L) 04/02/2017 0847   HCT 31.6 (L) 04/02/2017 7654     Per Protocol for Patient with estCrcl > 30 ml/min and BMI < 40, will transition to Lovenox 40 mg Q24h.

## 2017-04-02 NOTE — Consult Note (Signed)
Reason for Consult: Elevated troponin congestion shortness of breath Referring Physician: Dr. Catha Brow primary Dr. Brett Canales hospitalist  Randall Boyd Randall Boyd is an 83 y.o. male.  HPI: 83 year old white male presents with nausea vomiting and cough congestion.  Patient has history of diabetes hypertension hypothyroidism anxiety depression is a resident of assisted living facility had recurrent nausea and vomiting he was evaluated and found to be hypoxic he was put on 2 L BNP was elevated with a negative chest x-ray troponins were slightly elevated patient complained of bradycardia vague chest pain so was advised to be admitted for further assessment.  Denies any significant chest pain complained of some difficulty with swallowing and congestion denies any previous aspiration  Past Medical History:  Diagnosis Date  . Anxiety   . Depression   . DM (diabetes mellitus) ([place])   . Hypertension   . Hypothyroidism   . Skin cancer     Past Surgical History:  Procedure Laterality Date  . SKIN CANCER EXCISION      History reviewed. No pertinent family history.  Social History:  reports that he has quit smoking. he has never used smokeless tobacco. He reports that he does not drink alcohol or use drugs.  Allergies: No Known Allergies  Medications: Refer to previous medication list  Results for orders placed or performed during the hospital encounter of 04/02/17 (from the past 48 hour(s))  Comprehensive metabolic panel     Status: Abnormal   Collection Time: 04/02/17  8:47 AM  Result Value Ref Range   Sodium 136 135 - 145 mmol/L   Potassium 3.6 3.5 - 5.1 mmol/L   Chloride 101 101 - 111 mmol/L   CO2 25 22 - 32 mmol/L   Glucose, Bld 105 (H) 65 - 99 mg/dL   BUN 24 (H) 6 - 20 mg/dL   Creatinine, Ser 1.37 (H) 0.61 - 1.24 mg/dL   Calcium 9.0 8.9 - 10.3 mg/dL   Total Protein 6.8 6.5 - 8.1 g/dL   Albumin 2.6 (L) 3.5 - 5.0 g/dL   AST 17 15 - 41 U/L   ALT 8 (L) 17 - 63 U/L   Alkaline Phosphatase 72  38 - 126 U/L   Total Bilirubin 0.5 0.3 - 1.2 mg/dL   GFR calc non Af Amer 46 (L) >60 mL/min   GFR calc Af Amer 53 (L) >60 mL/min    Comment: (NOTE) The eGFR has been calculated using the CKD EPI equation. This calculation has not been validated in all clinical situations. eGFR's persistently <60 mL/min signify possible Chronic Kidney Disease.    Anion gap 10 5 - 15    Comment: Performed at [facility], [address] [place], [place] [address]  Brain natriuretic peptide     Status: Abnormal   Collection Time: 04/02/17  8:47 AM  Result Value Ref Range   B Natriuretic Peptide 330.0 (H) 0.0 - 100.0 pg/mL    Comment: Performed at [facility], [address] [place], [place] [address]  Troponin I     Status: Abnormal   Collection Time: 04/02/17  8:47 AM  Result Value Ref Range   Troponin I 0.07 (HH) <0.03 ng/mL    Comment: CRITICAL RESULT CALLED TO, READ BACK BY AND VERIFIED WITH Randall Boyd AT 1118 ON 04/02/17 BY SNJ Performed at [facility], [address] [place], [place] [address]   Lactic acid, plasma     Status: None   Collection Time: 04/02/17  8:47 AM  Result  Value Ref Range   Lactic Acid, Venous 1.4 0.5 - 1.9 mmol/L    Comment: Performed at [facility], [address] [place], [place] [address]  CBC with Differential     Status: Abnormal   Collection Time: 04/02/17  8:47 AM  Result Value Ref Range   WBC 14.1 (H) 3.8 - 10.6 K/uL   RBC 3.58 (L) 4.40 - 5.90 MIL/uL   Hemoglobin 10.1 (L) 13.0 - 18.0 g/dL   HCT 31.6 (L) 40.0 - 52.0 %   MCV 88.5 80.0 - 100.0 fL   MCH 28.3 26.0 - 34.0 pg   MCHC 32.0 32.0 - 36.0 g/dL   RDW 15.6 (H) 11.5 - 14.5 %   Platelets 448 (H) 150 - 440 K/uL   Neutrophils Relative % 79 %   Neutro Abs 11.2 (H) 1.4 - 6.5 K/uL   Lymphocytes Relative 15 %   Lymphs Abs 2.1 1.0 - 3.6 K/uL   Monocytes Relative 5 %   Monocytes Absolute 0.7 0.2 - 1.0 K/uL   Eosinophils Relative 0 %   Eosinophils Absolute 0.0 0 -  0.7 K/uL   Basophils Relative 1 %   Basophils Absolute 0.1 0 - 0.1 K/uL    Comment: Performed at [facility], [address] [place], [place] [address]  Troponin I     Status: Abnormal   Collection Time: 04/02/17 11:27 AM  Result Value Ref Range   Troponin I 0.06 (HH) <0.03 ng/mL    Comment: CRITICAL VALUE NOTED. VALUE IS CONSISTENT WITH PREVIOUSLY REPORTED/CALLED VALUE [facility] Performed at [facility], [address] [place], [place] [address]   Influenza panel by PCR (type A & B)     Status: None   Collection Time: 04/02/17  1:11 PM  Result Value Ref Range   Influenza A By PCR NEGATIVE NEGATIVE   Influenza B By PCR NEGATIVE NEGATIVE    Comment: (NOTE) The Xpert Xpress Flu assay is intended as an aid in the diagnosis of  influenza and should not be used as a sole basis for treatment.  This  assay is FDA approved for nasopharyngeal swab specimens only. Nasal  washings and aspirates are unacceptable for Xpert Xpress Flu testing. Performed at [facility], [address] [place], [place] [address]   Troponin I-serum (0, 3, 6 hours)     Status: Abnormal   Collection Time: 04/02/17  3:40 PM  Result Value Ref Range   Troponin I 0.04 (HH) <0.03 ng/mL    Comment: CRITICAL VALUE NOTED. VALUE IS CONSISTENT WITH PREVIOUSLY REPORTED/CALLED VALUE / [facility] Performed at [facility], [address] [place], [place] [address]   Glucose, capillary     Status: Abnormal   Collection Time: 04/02/17  4:41 PM  Result Value Ref Range   Glucose-Capillary 166 (H) 65 - 99 mg/dL   Comment 1 Notify RN    Comment 2 Document in Chart   MRSA PCR Screening     Status: None   Collection Time: 04/02/17  5:11 PM  Result Value Ref Range   MRSA by PCR NEGATIVE NEGATIVE    Comment:        The GeneXpert MRSA Assay (FDA approved for NASAL specimens only), is one component of a comprehensive MRSA colonization surveillance program. It is not intended to diagnose  MRSA infection nor to guide or monitor treatment for MRSA infections. Performed at [facility], [address], [place],  [address]   Troponin I-serum (0, 3,  6 hours)     Status: Abnormal   Collection Time: 04/02/17  6:07 PM  Result Value Ref Range   Troponin I 0.04 (HH) <0.03 ng/mL    Comment: CRITICAL VALUE NOTED. VALUE IS CONSISTENT WITH PREVIOUSLY REPORTED/CALLED VALUE.[facility] Performed at [facility], [address] [place], [place] [address]   Troponin I-serum (0, 3, 6 hours)     Status: Abnormal   Collection Time: 04/02/17  8:48 PM  Result Value Ref Range   Troponin I 0.04 (HH) <0.03 ng/mL    Comment: CRITICAL VALUE NOTED. VALUE IS CONSISTENT WITH PREVIOUSLY REPORTED/CALLED VALUE.[facility] Performed at [facility], [address] [place], [place] [address]   Glucose, capillary     Status: Abnormal   Collection Time: 04/02/17  9:12 PM  Result Value Ref Range   Glucose-Capillary 180 (H) 65 - 99 mg/dL    Dg Chest Portable 1 View  Result Date: 04/02/2017 CLINICAL DATA:  Shortness of breath EXAM: PORTABLE CHEST 1 VIEW COMPARISON:  11/04/2016 FINDINGS: Chronic cardiomegaly with interstitial coarsening at the bases. Large lung volumes. There is no edema, consolidation, effusion, or pneumothorax. IMPRESSION: No acute finding or change from priors. Electronically Signed   By: Monte Fantasia M.D.   On: 04/02/2017 09:31    Review of Systems  Constitutional: Positive for malaise/fatigue.  HENT: Positive for congestion.   Eyes: Negative.   Respiratory: Positive for cough and shortness of breath.   Cardiovascular: Positive for chest pain, palpitations, orthopnea, leg swelling and PND.  Genitourinary: Negative.   Musculoskeletal: Negative.   Skin: Negative.   Neurological: Positive for tremors and weakness.  Endo/Heme/Allergies: Negative.   Psychiatric/Behavioral: The patient is nervous/anxious.    Blood pressure 127/63, pulse 65, temperature 98.1  F (36.7 C), temperature source Oral, resp. rate 18, height _0  (1.854 m), weight 170 lb 8 oz (77.3 kg), SpO2 92 %. Physical Exam  Nursing note and vitals reviewed. Constitutional: He is oriented to person, place, and time. He appears well-developed and well-nourished.  HENT:  Head: Normocephalic and atraumatic.  Eyes: Conjunctivae and EOM are normal. Pupils are equal, round, and reactive to light.  Neck: Normal range of motion. Neck supple.  Cardiovascular: Normal rate and regular rhythm.  Murmur heard. Respiratory: He is in respiratory distress. He has decreased breath sounds. He has rhonchi.  Musculoskeletal: Normal range of motion.  Neurological: He is alert and oriented to person, place, and time. He has normal reflexes. He displays tremor. Coordination and gait abnormal.  Skin: Skin is warm and dry.  Psychiatric: He has a normal mood and affect.    Assessment/Plan: Elevated troponin Diabetes Congestion History of heart failure Hypertension Thyroid disease Hypoxemia Anxiety . Plan Agree with admission to telemetry Recommend diuretic therapy for congestion With inhalers as referred to pulmonary Swallowing study for possible evidence of aspiration Continue diabetes management We will hypertension controlled Recommend continued therapy for anxiety Noninvasive cardiac studies include an echocardiogram for further assessment Do not recommend cardiac cath  Mithcell Schumpert D Pavlos Yon 04/02/2017, 10:35 PM

## 2017-04-02 NOTE — ED Triage Notes (Signed)
Patient from [facility] complaining of shortness of breath and coughing up white phlegm this morning. Patient afebrile. Per EMS, upon their arrival patient was 89% on room air. 4 L  applied with saturation of 5%. Patient isoriented at baseline per facility.

## 2017-04-03 ENCOUNTER — Observation Stay
Admit: 2017-04-03 | Discharge: 2017-04-03 | Disposition: A | Discharge status: Home / Self Care | Payer: Medicare Other | Attending: Internal Medicine | Admitting: Internal Medicine

## 2017-04-03 DIAGNOSIS — J690 Pneumonitis due to inhalation of food and vomit: Secondary | ICD-10-CM | POA: Diagnosis not present

## 2017-04-03 DIAGNOSIS — R001 Bradycardia, unspecified: Secondary | ICD-10-CM | POA: Diagnosis present

## 2017-04-03 DIAGNOSIS — E1122 Type 2 diabetes mellitus with diabetic chronic kidney disease: Secondary | ICD-10-CM | POA: Diagnosis present

## 2017-04-03 DIAGNOSIS — F419 Anxiety disorder, unspecified: Secondary | ICD-10-CM | POA: Diagnosis present

## 2017-04-03 DIAGNOSIS — Z87891 Personal history of nicotine dependence: Secondary | ICD-10-CM | POA: Diagnosis not present

## 2017-04-03 DIAGNOSIS — G9341 Metabolic encephalopathy: Secondary | ICD-10-CM | POA: Diagnosis present

## 2017-04-03 DIAGNOSIS — I130 Hypertensive heart and chronic kidney disease with heart failure and stage 1 through stage 4 chronic kidney disease, or unspecified chronic kidney disease: Secondary | ICD-10-CM | POA: Diagnosis present

## 2017-04-03 DIAGNOSIS — F329 Major depressive disorder, single episode, unspecified: Secondary | ICD-10-CM | POA: Diagnosis present

## 2017-04-03 DIAGNOSIS — Z66 Do not resuscitate: Secondary | ICD-10-CM | POA: Diagnosis present

## 2017-04-03 DIAGNOSIS — F0390 Unspecified dementia without behavioral disturbance: Secondary | ICD-10-CM | POA: Diagnosis present

## 2017-04-03 DIAGNOSIS — A4151 Sepsis due to Escherichia coli [E. coli]: Secondary | ICD-10-CM | POA: Diagnosis present

## 2017-04-03 DIAGNOSIS — I509 Heart failure, unspecified: Secondary | ICD-10-CM | POA: Diagnosis present

## 2017-04-03 DIAGNOSIS — I248 Other forms of acute ischemic heart disease: Secondary | ICD-10-CM | POA: Diagnosis present

## 2017-04-03 DIAGNOSIS — Z79899 Other long term (current) drug therapy: Secondary | ICD-10-CM | POA: Diagnosis not present

## 2017-04-03 DIAGNOSIS — E039 Hypothyroidism, unspecified: Secondary | ICD-10-CM | POA: Diagnosis present

## 2017-04-03 DIAGNOSIS — Z794 Long term (current) use of insulin: Secondary | ICD-10-CM | POA: Diagnosis not present

## 2017-04-03 DIAGNOSIS — N390 Urinary tract infection, site not specified: Secondary | ICD-10-CM | POA: Diagnosis present

## 2017-04-03 DIAGNOSIS — Z85828 Personal history of other malignant neoplasm of skin: Secondary | ICD-10-CM | POA: Diagnosis not present

## 2017-04-03 DIAGNOSIS — N183 Chronic kidney disease, stage 3 (moderate): Secondary | ICD-10-CM | POA: Diagnosis present

## 2017-04-03 LAB — URINALYSIS, COMPLETE (UACMP) WITH MICROSCOPIC
Bilirubin Urine: NEGATIVE
GLUCOSE, UA: 50 mg/dL — AB
KETONES UR: NEGATIVE mg/dL
Leukocytes, UA: NEGATIVE
NITRITE: NEGATIVE
PROTEIN: NEGATIVE mg/dL
RBC / HPF: NONE SEEN RBC/hpf (ref 0–5)
Specific Gravity, Urine: 1.015 (ref 1.005–1.030)
pH: 5 (ref 5.0–8.0)

## 2017-04-03 LAB — GLUCOSE, CAPILLARY
GLUCOSE-CAPILLARY: 279 mg/dL — AB (ref 65–99)
Glucose-Capillary: 227 mg/dL — ABNORMAL HIGH (ref 65–99)
Glucose-Capillary: 263 mg/dL — ABNORMAL HIGH (ref 65–99)
Glucose-Capillary: 210 mg/dL — ABNORMAL HIGH (ref 65–99)

## 2017-04-03 LAB — HEMOGLOBIN A1C
Hgb A1c MFr Bld: 6.5 % — ABNORMAL HIGH (ref 4.8–5.6)
MEAN PLASMA GLUCOSE: 139.85 mg/dL

## 2017-04-03 LAB — BASIC METABOLIC PANEL
ANION GAP: 8 (ref 5–15)
BUN: 21 mg/dL — ABNORMAL HIGH (ref 6–20)
CALCIUM: 8.5 mg/dL — AB (ref 8.9–10.3)
CO2: 26 mmol/L (ref 22–32)
CREATININE: 1.31 mg/dL — AB (ref 0.61–1.24)
Chloride: 100 mmol/L — ABNORMAL LOW (ref 101–111)
GFR, EST AFRICAN AMERICAN: 56 mL/min — AB (ref >60)
GFR, EST NON AFRICAN AMERICAN: 48 mL/min — AB (ref >60)
GLUCOSE: 223 mg/dL — AB (ref 65–99)
Potassium: 3.9 mmol/L (ref 3.5–5.1)
Sodium: 134 mmol/L — ABNORMAL LOW (ref 135–145)

## 2017-04-03 LAB — CBC
HCT: 29.8 % — ABNORMAL LOW (ref 40.0–52.0)
Hemoglobin: 9.6 g/dL — ABNORMAL LOW (ref 13.0–18.0)
MCH: 28 pg (ref 26.0–34.0)
MCHC: 32 g/dL (ref 32.0–36.0)
MCV: 87.4 fL (ref 80.0–100.0)
PLATELETS: 370 10*3/uL (ref 150–440)
RBC: 3.41 MIL/uL — ABNORMAL LOW (ref 4.40–5.90)
RDW: 15.4 % — AB (ref 11.5–14.5)
WBC: 26.2 10*3/uL — AB (ref 3.8–10.6)

## 2017-04-03 LAB — PROCALCITONIN: Procalcitonin: 1.44 ng/mL

## 2017-04-03 LAB — PROTIME-INR
INR: 1.24
PROTHROMBIN TIME: 15.5 s — AB (ref 11.4–15.2)

## 2017-04-03 LAB — LACTIC ACID, PLASMA
LACTIC ACID, VENOUS: 1.2 mmol/L (ref 0.5–1.9)
Lactic Acid, Venous: 1.2 mmol/L (ref 0.5–1.9)

## 2017-04-03 LAB — APTT: APTT: 36 s (ref 24–36)

## 2017-04-03 MED ORDER — METOPROLOL TARTRATE 25 MG PO TABS
25.0000 mg | ORAL_TABLET | Freq: Every day | ORAL | Status: DC
  Administered 2017-04-03 – 2017-04-06 (×4): 25 mg via ORAL
  Filled 2017-04-03 (×4): qty 1

## 2017-04-03 MED ORDER — INSULIN GLARGINE 100 UNIT/ML ~~LOC~~ SOLN
20.0000 [IU] | Freq: Every day | SUBCUTANEOUS | Status: DC
  Administered 2017-04-03 – 2017-04-05 (×3): 20 [IU] via SUBCUTANEOUS
  Filled 2017-04-03 (×3): qty 0.2

## 2017-04-03 MED ORDER — DEXTROSE 5 % IV SOLN
2.0000 g | Freq: Once | INTRAVENOUS | Status: AC
  Administered 2017-04-03: 2 g via INTRAVENOUS
  Filled 2017-04-03 (×2): qty 2

## 2017-04-03 MED ORDER — CEFEPIME HCL 2 G IJ SOLR
2.0000 g | INTRAMUSCULAR | Status: DC
  Administered 2017-04-04: 2 g via INTRAVENOUS
  Filled 2017-04-03 (×2): qty 2

## 2017-04-03 NOTE — Care Management Obs Status (Signed)
[facility] NOTIFICATION   Patient Details  Name: Randall Boyd MRN: [id number] Date of Birth: Oct 25, 1930   Medicare Observation Status Notification Given:   Yes    Dakarri Kessinger A, RN 04/03/2017, 2:41 PM

## 2017-04-03 NOTE — Progress Notes (Signed)
PT Cancellation Note  Patient Details Name: Randall Boyd MRN: [id number] DOB: 22-Nov-1930   Cancelled Treatment:    Reason Eval/Treat Not Completed: Medical issues which prohibited therapy; Hold PT eval this date and reassess for appropriateness of PT eval 04/04/17 per MD request secondary to onset of fever and AMS.   Linus Salmons PT, DPT 04/03/17, 3:33 PM

## 2017-04-03 NOTE — Progress Notes (Signed)
ANTIBIOTIC CONSULT NOTE - INITIAL  Pharmacy Consult for Cefepime  Indication: UTI No Known Allergies  Patient Measurements: Height: 6\' 1"  (185.4 cm) Weight: 170 lb 8 oz (77.3 kg) IBW/kg (Calculated) : 79.9 Adjusted Body Weight:   Vital Signs: Temp: 98.2 F (36.8 C) (12/23 0830) Temp Source: Oral (12/23 0830) BP: 130/63 (12/23 0830) Pulse Rate: 73 (12/23 0830) Intake/Output from previous day: 12/22 0701 - 12/23 0700 In: 820 [I.V.:820] Out: -  Intake/Output from this shift: Total I/O In: 840 [P.O.:840] Out: -   Labs: Recent Labs    04/02/17 0847 04/03/17 0628  WBC 14.1* 26.2*  HGB 10.1* 9.6*  PLT 448* 370  CREATININE 1.37* 1.31*   Estimated Creatinine Clearance: 45.9 mL/min (A) (by C-G formula based on SCr of 1.31 mg/dL (H)). No results for input(s): VANCOTROUGH, VANCOPEAK, VANCORANDOM, GENTTROUGH, GENTPEAK, GENTRANDOM, TOBRATROUGH, TOBRAPEAK, TOBRARND, AMIKACINPEAK, AMIKACINTROU, AMIKACIN in the last 72 hours.   Microbiology: Recent Results (from the past 720 hour(s))  MRSA PCR Screening     Status: None   Collection Time: 04/02/17  5:11 PM  Result Value Ref Range Status   MRSA by PCR NEGATIVE NEGATIVE Final    Comment:        The GeneXpert MRSA Assay (FDA approved for NASAL specimens only), is one component of a comprehensive MRSA colonization surveillance program. It is not intended to diagnose MRSA infection nor to guide or monitor treatment for MRSA infections. Performed at [facility], [address] [place], [place] [address]     Medical History: Past Medical History:  Diagnosis Date  . Anxiety   . Depression   . DM (diabetes mellitus) ([place])   . Hypertension   . Hypothyroidism   . Skin cancer     Medications:  Medications Prior to Admission  Medication Sig Dispense Refill Last Dose  . acetaminophen (TYLENOL) 325 MG tablet Take 650 mg by mouth every 6 (six) hours as needed.   04/02/2017 at 0600  . amLODipine (NORVASC) 5 MG  tablet Take 10 mg by mouth daily.  3 04/02/2017 at 0600  . atorvastatin (LIPITOR) 20 MG tablet Take 20 mg by mouth daily.   04/01/2017 at 1800  . citalopram (CELEXA) 40 MG tablet Take 40 mg by mouth daily.  3 04/02/2017 at 0600  . donepezil (ARICEPT) 10 MG tablet Take 10 mg by mouth at bedtime.   3 04/01/2017 at 1800  . finasteride (PROSCAR) 5 MG tablet Take 5 mg by mouth daily.  3 04/02/2017 at 0600  . furosemide (LASIX) 20 MG tablet Take 40 mg by mouth daily.   3 04/02/2017 at 0600  . insulin lispro (HUMALOG) 100 UNIT/ML injection Inject 0-10 Units into the skin 3 (three) times daily before meals. BS <80, give 0 units BS 81-125, give 4 units BS 126-200, give 8 units BS >201, give 10 units   04/02/2017 at 0600  . LANTUS SOLOSTAR 100 UNIT/ML Solostar Pen Inject 25 Units into the muscle daily.   3 04/01/2017 at 2000  . levothyroxine (SYNTHROID, LEVOTHROID) 100 MCG tablet Take 100 mcg by mouth daily.  3 04/02/2017 at 0600  . lisinopril (PRINIVIL,ZESTRIL) 40 MG tablet Take 40 mg by mouth daily.  1 04/02/2017 at 0600  . metFORMIN (GLUCOPHAGE) 1000 MG tablet Take 1,000 mg by mouth 2 (two) times daily.  3 04/02/2017 at 0600  . metoprolol tartrate (LOPRESSOR) 25 MG tablet Take 25 mg by mouth daily.    04/02/2017 at 0600  . potassium chloride (K-DUR) 10 MEQ  tablet Take 10 mEq by mouth daily.   04/02/2017 at 0600  . psyllium (REGULOID) 0.52 g capsule Take 0.52 g by mouth daily.   04/02/2017 at 0600  . ranitidine (ZANTAC) 150 MG tablet Take 150 mg by mouth 2 (two) times daily.  3 04/02/2017 at 0600  . risperiDONE (RISPERDAL) 0.25 MG tablet Take 0.25 mg by mouth daily.   3 04/01/2017 at 1800  . saccharomyces boulardii (FLORASTOR) 250 MG capsule Take 250 mg by mouth daily.   04/02/2017 at 0600  . tamsulosin (FLOMAX) 0.4 MG CAPS capsule Take 0.4 mg by mouth every evening.   3 04/01/2017 at 1800  . terazosin (HYTRIN) 5 MG capsule Take 5 mg by mouth every evening.   3 04/01/2017 at 1800  . guaiFENesin  (DIABETIC TUSSIN EX) 100 MG/5ML liquid Take 200 mg by mouth 3 (three) times daily as needed for cough.   prn at prn  . loratadine (CLARITIN) 10 MG tablet Take 10 mg by mouth daily as needed for allergies.   prn at prn  . LORazepam (ATIVAN) 0.5 MG tablet Take 0.5-1 mg by mouth 2 (two) times daily. 1 tab qam and 2 tabs qpm  3 prn at prn   Scheduled:  . amLODipine  10 mg Oral Daily  . atorvastatin  20 mg Oral Daily  . citalopram  40 mg Oral Daily  . donepezil  10 mg Oral QHS  . enoxaparin (LOVENOX) injection  40 mg Subcutaneous Q24H  . famotidine  20 mg Oral BID  . finasteride  5 mg Oral Daily  . insulin aspart  0-5 Units Subcutaneous QHS  . insulin aspart  0-9 Units Subcutaneous TID WC  . insulin glargine  20 Units Subcutaneous Daily  . levothyroxine  100 mcg Oral QAC breakfast  . lisinopril  40 mg Oral Daily  . metoprolol tartrate  25 mg Oral Daily  . potassium chloride  10 mEq Oral Daily  . psyllium  1 packet Oral Daily  . risperiDONE  0.25 mg Oral Daily  . saccharomyces boulardii  250 mg Oral Daily  . tamsulosin  0.4 mg Oral QPM  . terazosin  5 mg Oral QPM   Assessment: Pharmacy consulted to dose and monitor Cefepime in this 83 year old with urinary tract infection. Patient received Cefepime 2 g IV x 1.   Goal of Therapy:    Plan:  Will start Cefepime 2 g IV q24 hours on 12/24 @ 1300.   Armany Mano D 04/03/2017,2:00 PM

## 2017-04-03 NOTE — Progress Notes (Addendum)
Randall Boyd NAME: Randall Boyd    MR#:  [id number]  DATE OF BIRTH:  01-11-1931  SUBJECTIVE:  CHIEF COMPLAINT:   Chief Complaint  Patient presents with  . Shortness of Breath  . Cough   The patient is lethargic and confused.  Fever 101.9 and WBC increased to 26.2. REVIEW OF SYSTEMS:  Review of Systems  Unable to perform ROS: Mental status change    DRUG ALLERGIES:  No Known Allergies VITALS:  Blood pressure 130/63, pulse 73, temperature 98.2 F (36.8 C), temperature source Oral, resp. rate 17, height 6\' 1"  (1.854 m), weight 170 lb 8 oz (77.3 kg), SpO2 93 %. PHYSICAL EXAMINATION:  Physical Exam  Constitutional: He is well-developed, well-nourished, and in no distress.  HENT:  Head: Normocephalic.  Mouth/Throat: Oropharynx is clear and moist.  Eyes: Conjunctivae and EOM are normal. Pupils are equal, round, and reactive to light. No scleral icterus.  Neck: Normal range of motion. Neck supple. No JVD present. No tracheal deviation present.  Cardiovascular: Normal rate and regular rhythm. Exam reveals no gallop.  Murmur heard. Pulmonary/Chest: Effort normal and breath sounds normal. No respiratory distress. He has no wheezes. He has no rales.  Abdominal: Soft. Bowel sounds are normal. He exhibits no distension. There is no tenderness. There is no rebound.  Musculoskeletal: Normal range of motion. He exhibits no edema or tenderness.  Neurological:  Confused and lethargic.  Skin: No rash noted. No erythema.   LABORATORY PANEL:  Male CBC Recent Labs  Lab 04/03/17 0628  WBC 26.2*  HGB 9.6*  HCT 29.8*  PLT 370   ------------------------------------------------------------------------------------------------------------------ Chemistries  Recent Labs  Lab 04/02/17 0847 04/03/17 0628  NA 136 134*  K 3.6 3.9  CL 101 100*  CO2 25 26  GLUCOSE 105* 223*  BUN 24* 21*  CREATININE 1.37* 1.31*  CALCIUM 9.0 8.5*  AST  17  --   ALT 8*  --   ALKPHOS 72  --   BILITOT 0.5  --    RADIOLOGY:  No results found. ASSESSMENT AND PLAN:   Randall Boyd  is a 83 y.o. male with a known history of diabetes mellitus, hypertension, hypothyroidism, anxiety and depression is brought into the ED from assisted living facility and being nauseous and vomiting couple of times last night. Patient's pulse ox was 89% on room air initially but on 2 L it went up to 94%. BNP is elevated with chest x-rays negative. Troponin is slightly elevated at 0.07.   Sepsis, possible due to UTI.  Fever with worsening leukocytosis. Sepsis protocol. Follow-up urinalysis and urine culture.  Start cefepime IV  Acute metabolic encephalopathy due to above. Aspiration and fall precaution.  # elevated troponin-could be from demand ischemia Telemetry monitoring  troponins are stable. Echocardiogram is pending. Cardio consult  Chest x-rays negative but BNP is slightly elevated   #Sinus bradycardia-could be from dry heaving, improved. resume beta blocker metoprolol.  History of chronic CHF.  Unknown type. Recommend diuretic therapy for congestion per Dr. Clayborn Bigness.  #Nausea and vomiting Hydrate with IV fluids resume Lasix   #insulin-dependent diabetes mellitus Advance diet. Hold metformin, resume Lantus, continue sliding scale insulin   #Essential hypertension Continue home medication terazosin, Norvasc and lisinopril Resume Lopressor.  CKD Stage III.  Stable.  All the records are reviewed and case discussed with Care Management/Social Worker. Management plans discussed with the patient, his daughter and they are in agreement.  CODE STATUS: DNR  TOTAL TIME TAKING CARE OF THIS PATIENT: 37 minutes.   More than 50% of the time was spent in counseling/coordination of care: YES  POSSIBLE D/C IN 2 DAYS, DEPENDING ON CLINICAL CONDITION.   Randall Boyd M.D on 04/03/2017 at 12:20 PM  Between 7am to 6pm - Pager -  [phone]  After 6pm go to [email or web address] - [facility]

## 2017-04-03 NOTE — Plan of Care (Signed)
Pt responds to verbal stimulation, oriented to self only. VSS. RA . NSR on monitor. Condom catheter in place. Family at bedside earlier in shift. IV antibiotics given per order. Pt being repositioned q2 hours. Will continue to monitor and report to oncoming RN .  Progressing Education: Knowledge of General Education information will improve 04/03/2017 1429 - Progressing by Aleen Campi, RN Health Behavior/Discharge Planning: Ability to manage health-related needs will improve 04/03/2017 1429 - Progressing by Aleen Campi, RN Clinical Measurements: Ability to maintain clinical measurements within normal limits will improve 04/03/2017 1429 - Progressing by Aleen Campi, RN Will remain free from infection 04/03/2017 1429 - Progressing by Aleen Campi, RN Diagnostic test results will improve 04/03/2017 1429 - Progressing by Aleen Campi, RN Respiratory complications will improve 04/03/2017 1429 - Progressing by Aleen Campi, RN Cardiovascular complication will be avoided 04/03/2017 1429 - Progressing by Aleen Campi, RN Activity: Risk for activity intolerance will decrease 04/03/2017 1429 - Progressing by Aleen Campi, RN Nutrition: Adequate nutrition will be maintained 04/03/2017 1429 - Progressing by Aleen Campi, RN Coping: Level of anxiety will decrease 04/03/2017 1429 - Progressing by Aleen Campi, RN Elimination: Will not experience complications related to bowel motility 04/03/2017 1429 - Progressing by Aleen Campi, RN Will not experience complications related to urinary retention 04/03/2017 1429 - Progressing by Aleen Campi, RN Pain Managment: General experience of comfort will improve 04/03/2017 1429 - Progressing by Aleen Campi, RN Safety: Ability to remain free from injury will improve 04/03/2017 1429 - Progressing by Aleen Campi, RN Skin Integrity: Risk for impaired skin integrity will decrease 04/03/2017 1429 - Progressing by Aleen Campi, RN

## 2017-04-04 LAB — ECHOCARDIOGRAM COMPLETE
CHL CUP MV DEC (S): 282
E/e' ratio: 10.08
EWDT: 282 ms
FS: 47 % — AB (ref 28–44)
Height: 73 in
IV/PV OW: 1.12
LA vol: 100 mL
LADIAMINDEX: 2.36 cm/m2
LASIZE: 47 mm
LAVOLA4C: 87.1 mL
LAVOLIN: 50.2 mL/m2
LEFT ATRIUM END SYS DIAM: 47 mm
LV E/e' medial: 10.08
LV PW d: 10.4 mm — AB (ref 0.6–1.1)
LV TDI E'LATERAL: 7.51
LV TDI E'MEDIAL: 4.24
LVEEAVG: 10.08
LVELAT: 7.51 cm/s
MV pk A vel: 98 m/s
MV pk E vel: 75.7 m/s
MVAP: 2.65 cm2
MVPG: 2 mmHg
P 1/2 time: 83 ms
RV LATERAL S' VELOCITY: 16.4 cm/s
TAPSE: 30 mm
Weight: 2728 oz

## 2017-04-04 LAB — GLUCOSE, CAPILLARY
GLUCOSE-CAPILLARY: 180 mg/dL — AB (ref 65–99)
GLUCOSE-CAPILLARY: 258 mg/dL — AB (ref 65–99)
GLUCOSE-CAPILLARY: 251 mg/dL — AB (ref 65–99)
Glucose-Capillary: 301 mg/dL — ABNORMAL HIGH (ref 65–99)

## 2017-04-04 LAB — CBC
HCT: 27 % — ABNORMAL LOW (ref 40.0–52.0)
HEMOGLOBIN: 8.8 g/dL — AB (ref 13.0–18.0)
MCH: 28.5 pg (ref 26.0–34.0)
MCHC: 32.5 g/dL (ref 32.0–36.0)
MCV: 87.8 fL (ref 80.0–100.0)
PLATELETS: 307 10*3/uL (ref 150–440)
RBC: 3.07 MIL/uL — AB (ref 4.40–5.90)
RDW: 15.6 % — ABNORMAL HIGH (ref 11.5–14.5)
WBC: 17.5 10*3/uL — AB (ref 3.8–10.6)

## 2017-04-04 MED ORDER — FUROSEMIDE 40 MG PO TABS
40.0000 mg | ORAL_TABLET | Freq: Every day | ORAL | Status: DC
  Administered 2017-04-04 – 2017-04-06 (×3): 40 mg via ORAL
  Filled 2017-04-04 (×3): qty 1

## 2017-04-04 NOTE — Care Management (Addendum)
Patient is from [facility] and initially placed in observation. It sounds as if patient spends most of his time in his wheelchair and is only able to walk short distances on occasion.  CSW is aware

## 2017-04-04 NOTE — Plan of Care (Signed)
Pt is drowsy, oriented to self only. VSS. RA . NSR on monitor. Q2 turns provided for pt. Incontinent. Family at bedside intermittently during shift. IV antibiotic continued per order. Will continue to monitor and report to oncoming RN .  Progressing Education: Knowledge of General Education information will improve 04/04/2017 1439 - Progressing by Aleen Campi, RN Health Behavior/Discharge Planning: Ability to manage health-related needs will improve 04/04/2017 1439 - Progressing by Aleen Campi, RN Clinical Measurements: Ability to maintain clinical measurements within normal limits will improve 04/04/2017 1439 - Progressing by Aleen Campi, RN Will remain free from infection 04/04/2017 1439 - Progressing by Aleen Campi, RN Diagnostic test results will improve 04/04/2017 1439 - Progressing by Aleen Campi, RN Respiratory complications will improve 04/04/2017 1439 - Progressing by Aleen Campi, RN Cardiovascular complication will be avoided 04/04/2017 1439 - Progressing by Aleen Campi, RN Activity: Risk for activity intolerance will decrease 04/04/2017 1439 - Progressing by Aleen Campi, RN Nutrition: Adequate nutrition will be maintained 04/04/2017 1439 - Progressing by Aleen Campi, RN Coping: Level of anxiety will decrease 04/04/2017 1439 - Progressing by Aleen Campi, RN Elimination: Will not experience complications related to bowel motility 04/04/2017 1439 - Progressing by Aleen Campi, RN Will not experience complications related to urinary retention 04/04/2017 1439 - Progressing by Aleen Campi, RN Pain Managment: General experience of comfort will improve 04/04/2017 1439 - Progressing by Aleen Campi, RN Safety: Ability to remain free from injury will improve 04/04/2017 1439 - Progressing by Aleen Campi, RN Skin Integrity: Risk for impaired skin integrity will decrease 04/04/2017 1439 - Progressing by Aleen Campi, RN

## 2017-04-04 NOTE — NC FL2 (Signed)
[place] LEVEL OF CARE SCREENING TOOL     IDENTIFICATION  Patient Name: Randall Boyd Birthdate: 1930-10-29 Sex: male Admission Date (Current Location): 04/02/2017  [place] and [place] Number:  Engineering geologist and Address:  [facility], [address], [place], [place] [address]      Provider Number: [id number]  Attending Physician Name and Address:  [name], MD  Relative Name and Phone Number:  [name] Daughter [phone]     Current Level of Care: Hospital Recommended Level of Care: [place] Prior Approval Number:    Date Approved/Denied:   PASRR Number: [id number]  Discharge Plan: SNF    Current Diagnoses: Patient Active Problem List   Diagnosis Date Noted  . Acute metabolic encephalopathy 98/92/1194  . Elevated troponin 04/02/2017  . Confusion 05/12/2016    Orientation RESPIRATION BLADDER Height & Weight     Self  Normal Incontinent Weight: 173 lb 3.2 oz (78.6 kg) Height:  6\' 1"  (185.4 cm)  BEHAVIORAL SYMPTOMS/MOOD NEUROLOGICAL BOWEL NUTRITION STATUS      Continent Diet(Clear Liquid)  AMBULATORY STATUS COMMUNICATION OF NEEDS Skin   Limited Assist Verbally Normal                       Personal Care Assistance Level of Assistance  Bathing, Feeding, Dressing Bathing Assistance: Limited assistance Feeding assistance: Limited assistance Dressing Assistance: Limited assistance     Functional Limitations Info  Sight, Hearing, Speech Sight Info: Adequate Hearing Info: Adequate Speech Info: Adequate    SPECIAL CARE FACTORS FREQUENCY  PT (By licensed PT)     PT Frequency: 5x a week              Contractures Contractures Info: Not present    Additional Factors Info  Code Status, Allergies, Psychotropic, Insulin Sliding Scale Code Status Info: DNR Allergies Info: NKA Psychotropic Info: citalopram (CELEXA) tablet 40 mg and risperiDONE (RISPERDAL) tablet 0.25 mg   Insulin Sliding Scale Info: insulin aspart (novoLOG) injection 0-9 Units 3x a day with meals       Current Medications (04/04/2017):  This is the current hospital active medication list Current Facility-Administered Medications  Medication Dose Route Frequency Provider Last Rate Last Dose  . acetaminophen (TYLENOL) tablet 650 mg  650 mg Oral Q6H PRN Gouru, Aruna, MD      . amLODipine (NORVASC) tablet 10 mg  10 mg Oral Daily Gouru, Aruna, MD   10 mg at 04/04/17 0815  . atorvastatin (LIPITOR) tablet 20 mg  20 mg Oral Daily Gouru, Aruna, MD   20 mg at 04/04/17 1654  . ceFEPIme (MAXIPIME) 2 g in dextrose 5 % 50 mL IVPB  2 g Intravenous Q24H [place], [facility]   Stopped at 04/04/17 1204  . citalopram (CELEXA) tablet 40 mg  40 mg Oral Daily Gouru, Aruna, MD   40 mg at 04/04/17 0816  . donepezil (ARICEPT) tablet 10 mg  10 mg Oral QHS Gouru, Illene Silver, MD   10 mg at 04/03/17 2141  . enoxaparin (LOVENOX) injection 40 mg  40 mg Subcutaneous Q24H Gouru, Aruna, MD   40 mg at 04/03/17 2142  . famotidine (PEPCID) tablet 20 mg  20 mg Oral BID Nicholes Mango, MD   20 mg at 04/04/17 0816  . finasteride (PROSCAR) tablet 5 mg  5 mg Oral Daily Gouru, Aruna, MD   5 mg at 04/04/17 0814  . furosemide (LASIX) tablet 40 mg  40 mg Oral  Daily [name], MD   40 mg at 04/04/17 1414  . gi cocktail (Maalox,Lidocaine,Donnatal)  30 mL Oral QID PRN Gouru, Aruna, MD      . guaiFENesin (ROBITUSSIN) 100 MG/5ML solution 200 mg  200 mg Oral TID PRN Gouru, Aruna, MD      . insulin aspart (novoLOG) injection 0-5 Units  0-5 Units Subcutaneous QHS Nicholes Mango, MD   3 Units at 04/03/17 2141  . insulin aspart (novoLOG) injection 0-9 Units  0-9 Units Subcutaneous TID WC Gouru, Aruna, MD   5 Units at 04/04/17 1655  . insulin glargine (LANTUS) injection 20 Units  20 Units Subcutaneous Daily [name], MD   20 Units at 04/04/17 [phone]  . levothyroxine (SYNTHROID, LEVOTHROID) tablet 100 mcg  100 mcg Oral QAC breakfast Gouru, Aruna, MD   100  mcg at 04/04/17 0815  . lisinopril (PRINIVIL,ZESTRIL) tablet 40 mg  40 mg Oral Daily Gouru, Aruna, MD   40 mg at 04/04/17 0816  . loratadine (CLARITIN) tablet 10 mg  10 mg Oral Daily PRN Gouru, Aruna, MD      . LORazepam (ATIVAN) tablet 0.5-1 mg  0.5-1 mg Oral BID PRN Gouru, Aruna, MD      . metoprolol tartrate (LOPRESSOR) tablet 25 mg  25 mg Oral Daily [name], MD   25 mg at 04/04/17 0815  . ondansetron (ZOFRAN) injection 4 mg  4 mg Intravenous Q6H PRN Gouru, Aruna, MD      . potassium chloride (K-DUR,KLOR-CON) CR tablet 10 mEq  10 mEq Oral Daily Gouru, Aruna, MD   10 mEq at 04/04/17 0815  . psyllium (HYDROCIL/METAMUCIL) packet 1 packet  1 packet Oral Daily Gouru, Illene Silver, MD   1 packet at 04/04/17 0817  . risperiDONE (RISPERDAL) tablet 0.25 mg  0.25 mg Oral Daily Gouru, Aruna, MD   0.25 mg at 04/04/17 1654  . saccharomyces boulardii (FLORASTOR) capsule 250 mg  250 mg Oral Daily Gouru, Aruna, MD   250 mg at 04/04/17 0816  . tamsulosin (FLOMAX) capsule 0.4 mg  0.4 mg Oral QPM Gouru, Aruna, MD   0.4 mg at 04/04/17 1654  . terazosin (HYTRIN) capsule 5 mg  5 mg Oral QPM Gouru, Aruna, MD   5 mg at 04/04/17 1715     Discharge Medications: Please see discharge summary for a list of discharge medications.  Relevant Imaging Results:  Relevant Lab Results:   Additional Information SSN [id number]  Ross Ludwig, [place]

## 2017-04-04 NOTE — Care Management Important Message (Signed)
Important Message  Patient Details  Name: Randall Boyd MRN: [id number] Date of Birth: [birth year]   Medicare Important Message Given:  Yes  Signed IM notice given   Katrina Stack, RN 04/04/2017, 4:48 PM

## 2017-04-04 NOTE — Clinical Social Work Note (Signed)
Clinical Social Work Assessment  Patient Details  Name: Randall Boyd MRN: [id number] Date of Birth: 1930/05/04  Date of referral:  04/04/17               Reason for consult:  Facility Placement                Permission sought to share information with:  Facility Sport and exercise psychologist, Family Supports Permission granted to share information::  Yes, Verbal Permission Granted  Name::     Randall Boyd Daughter [phone]   Agency::  SNF admissions  Relationship::     Contact Information:     Housing/Transportation Living arrangements for the past 2 months:  Assisted Living Facility([facility]) Source of Information:  Medical Team, Adult Children Patient Interpreter Needed:  None Criminal Activity/Legal Involvement Pertinent to Current Situation/Hospitalization:  No - Comment as needed Significant Relationships:  Adult Children Lives with:  Facility Resident Do you feel safe going back to the place where you live?  No Need for family participation in patient care:  Yes (Comment)  Care giving concerns: Patient and family feel he needs some short term rehab first before he is able to return back home.   Social Worker assessment / plan:  Patient is an 83 year old male who has some dementia, alert and oriented x1, patient lives at [facility], patient's daughter was at bedside.  CSW completed assessment by speaking with patient's daughter due to patient being to sleepy and having dementia.   Patient has been at [facility] for just over a year, per patient's daughter he gets along well with everyone and participates in activities with the ALF.  Patient has been to rehab in the past at [facility], however they did not want to go back to [facility], but they are interested in other SNFs.  CSW explained to patient's daughter about how insurance will pay for stay and what to expect at SNF.  Patient's family asked about what if patient needs a higher level  of care, CSW explained to them, the social worker at SNF can assist with helping them decide what the next step will be.  Patient's daughter asked about eligibility for Medicaid, CSW informed her that there are different types of Medicaid, and he may not be approved for the regular one, but he may be approved for other types of Medicaid.  CSW told the patient's daughter that she would have to contact DSS and ask them about what other types of Medicaid he may be eligible for.  Patient's daughter said that patient receives some VA benefits, CSW explained to her she would have to talk to the [place] to find out what VA benefits he is able to use to assist with paying for SNF if he needs to go.  Patient's daughter was appreciative of information given and gave CSW permission to begin bed search in [place].  Patient and family did not have any other questions or concerns.  Employment status:  Retired Nurse, adult PT Recommendations:  [address] / Referral to community resources:  [place]  Patient/Family's Response to care:  Patient and family agreeable to SNF placement.  Patient/Family's Understanding of and Emotional Response to Diagnosis, Current Treatment, and Prognosis:  Patient's family is aware of current treatment plan and hope that SNF can e Emotional Assessment Appearance:  Appears stated age Attitude/Demeanor/Rapport:    Affect (typically observed):  Appropriate, Calm, Stable Orientation:  Oriented to Self Alcohol / Substance use:  Not Applicable Psych involvement (Current and /or in the community):  No (Comment)  Discharge Needs  Concerns to be addressed:    Readmission within the last 30 days:  No Current discharge risk:  Cognitively Impaired, Lack of support system Barriers to Discharge:  Continued Medical Work up   Randall Boyd 04/04/2017, 6:23 PM

## 2017-04-04 NOTE — Progress Notes (Signed)
PT Cancellation Note  Patient Details Name: Randall Boyd MRN: [id number] DOB: 04/18/1930   Cancelled Treatment:    Reason Eval/Treat Not Completed: Other (comment). Consult received. Evaluation attempted. Pt alert and oriented to self, day, place. Family not in room for history. Unsure of accuracy. Per pt, he resides at ALF, however receives total care from staff including bathing, dressing, and only occasionally feeds self. Reports he has not walked in 1 year and staff uses lift to transfer to chair. Discussed with RN if this is true baseline. Pt able to lift all extremities off bed, however refuses further mobility. Unable to fully assess functional baseline. Will re-attempt Wednesday if PT eval still necessary.   [name] 04/04/2017, 10:10 AM  Greggory Stallion, PT, DPT [phone]

## 2017-04-04 NOTE — Progress Notes (Addendum)
[facility] [place] NAME: Randall Boyd    MR#:  [id number]  DATE OF BIRTH:  Jun 26, 1930  SUBJECTIVE:  CHIEF COMPLAINT:   Chief Complaint  Patient presents with  . Shortness of Breath  . Cough   The patient is demented. REVIEW OF SYSTEMS:  Review of Systems  Unable to perform ROS: Mental status change    DRUG ALLERGIES:  No Known Allergies VITALS:  Blood pressure (!) 154/70, pulse 65, temperature 98 F (36.7 C), resp. rate 18, height 6\' 1"  (1.854 m), weight 170 lb 8 oz (77.3 kg), SpO2 91 %. PHYSICAL EXAMINATION:  Physical Exam  Constitutional: He is well-developed, well-nourished, and in no distress.  HENT:  Head: Normocephalic.  Mouth/Throat: Oropharynx is clear and moist.  Eyes: Conjunctivae and EOM are normal. Pupils are equal, round, and reactive to light. No scleral icterus.  Neck: Normal range of motion. Neck supple. No JVD present. No tracheal deviation present.  Cardiovascular: Normal rate and regular rhythm. Exam reveals no gallop.  Murmur heard. Pulmonary/Chest: Effort normal and breath sounds normal. No respiratory distress. He has no wheezes. He has no rales.  Abdominal: Soft. Bowel sounds are normal. He exhibits no distension. There is no tenderness. There is no rebound.  Musculoskeletal: Normal range of motion. He exhibits no edema or tenderness.  Neurological: No cranial nerve deficit.  Awake but demented, follow commands.  Skin: No rash noted. No erythema.  Psychiatric:  He looks demented.   LABORATORY PANEL:  Male CBC Recent Labs  Lab 04/04/17 0530  WBC 17.5*  HGB 8.8*  HCT 27.0*  PLT 307   ------------------------------------------------------------------------------------------------------------------ Chemistries  Recent Labs  Lab 04/02/17 0847 04/03/17 0628  NA 136 134*  K 3.6 3.9  CL 101 100*  CO2 25 26  GLUCOSE 105* 223*  BUN 24* 21*  CREATININE 1.37* 1.31*  CALCIUM 9.0 8.5*  AST 17   --   ALT 8*  --   ALKPHOS 72  --   BILITOT 0.5  --    RADIOLOGY:  No results found. ASSESSMENT AND PLAN:   Randall Boyd  is a 83 y.o. male with a known history of diabetes mellitus, hypertension, hypothyroidism, anxiety and depression is brought into the ED from assisted living facility and being nauseous and vomiting couple of times last night. Patient's pulse ox was 89% on room air initially but on 2 L it went up to 94%. BNP is elevated with chest x-rays negative. Troponin is slightly elevated at 0.07.   Sepsis, unclear eitology. Per UA: no UTI.  Continue cefepime.  Follow-up blood culture.  Acute metabolic encephalopathy due to above.  Improving to baseline. Aspiration and fall precaution.  # elevated troponin-could be from demand ischemia Telemetry monitoring  troponins are stable. Echocardiogram: LV EF: 65% -   70% Chest x-rays negative but BNP is slightly elevated   #Sinus bradycardia-could be from dry heaving, improved. resumed beta blocker metoprolol.  History of chronic CHF.  Unknown type. Recommend diuretic therapy for congestion per Dr. Clayborn Bigness.  #Nausea and vomiting Hydrate with IV fluids resumed Lasix   #insulin-dependent diabetes mellitus Advanced diet. Hold metformin, resume Lantus, continue sliding scale insulin   #Essential hypertension Continue home medication terazosin, Norvasc and lisinopril Resumed Lopressor.  CKD Stage III.  Stable.  Generalized weakness.  PT evaluation is pending.  All the records are reviewed and case discussed with Care Management/Social Worker. Management plans discussed with the patient, his daughter and they are  in agreement.  CODE STATUS: DNR  TOTAL TIME TAKING CARE OF THIS PATIENT: 32 minutes.   More than 50% of the time was spent in counseling/coordination of care: YES  POSSIBLE D/C IN 1-2 DAYS, DEPENDING ON CLINICAL CONDITION.   Randall Boyd M.D on 04/04/2017 at 1:10 PM  Between 7am to 6pm - Pager -  [phone]  After 6pm go to [email or web address] - [facility]

## 2017-04-05 ENCOUNTER — Inpatient Hospital Stay: Payer: Medicare Other

## 2017-04-05 LAB — URINE CULTURE

## 2017-04-05 LAB — GLUCOSE, CAPILLARY
GLUCOSE-CAPILLARY: 180 mg/dL — AB (ref 65–99)
GLUCOSE-CAPILLARY: 173 mg/dL — AB (ref 65–99)
GLUCOSE-CAPILLARY: 138 mg/dL — AB (ref 65–99)
Glucose-Capillary: 207 mg/dL — ABNORMAL HIGH (ref 65–99)

## 2017-04-05 LAB — CBC
HCT: 26.9 % — ABNORMAL LOW (ref 40.0–52.0)
HEMOGLOBIN: 8.7 g/dL — AB (ref 13.0–18.0)
MCH: 28.4 pg (ref 26.0–34.0)
MCHC: 32.3 g/dL (ref 32.0–36.0)
MCV: 87.9 fL (ref 80.0–100.0)
PLATELETS: 297 10*3/uL (ref 150–440)
RBC: 3.06 MIL/uL — AB (ref 4.40–5.90)
RDW: 15.5 % — ABNORMAL HIGH (ref 11.5–14.5)
WBC: 13.9 10*3/uL — AB (ref 3.8–10.6)

## 2017-04-05 MED ORDER — PIPERACILLIN-TAZOBACTAM 3.375 G IVPB
3.3750 g | Freq: Three times a day (TID) | INTRAVENOUS | Status: DC
  Administered 2017-04-05 – 2017-04-07 (×5): 3.375 g via INTRAVENOUS
  Filled 2017-04-05 (×5): qty 50

## 2017-04-05 MED ORDER — INSULIN GLARGINE 100 UNIT/ML ~~LOC~~ SOLN
25.0000 [IU] | Freq: Every day | SUBCUTANEOUS | Status: DC
  Administered 2017-04-06 – 2017-04-07 (×2): 25 [IU] via SUBCUTANEOUS
  Filled 2017-04-05 (×2): qty 0.25

## 2017-04-05 MED ORDER — DEXTROSE 5 % IV SOLN
1.0000 g | INTRAVENOUS | Status: DC
  Administered 2017-04-05: 1 g via INTRAVENOUS
  Filled 2017-04-05 (×2): qty 10

## 2017-04-05 MED ORDER — SODIUM CHLORIDE 0.9% FLUSH
3.0000 mL | Freq: Two times a day (BID) | INTRAVENOUS | Status: DC
  Administered 2017-04-05 – 2017-04-07 (×4): 3 mL via INTRAVENOUS

## 2017-04-05 NOTE — Progress Notes (Signed)
Pharmacy Antibiotic Note  Randall Boyd is a 83 y.o. male admitted on 04/02/2017 with UTI.  Pharmacy has been consulted for ceftriaxone dosing.  Plan: Stop cefepime and initiate ceftriaxone 1g q24h   Height: 6\' 1"  (185.4 cm) Weight: 171 lb 14.4 oz (78 kg) IBW/kg (Calculated) : 79.9  Temp (24hrs), Avg:98.8 F (37.1 C), Min:98.1 F (36.7 C), Max:100.1 F (37.8 C)  Recent Labs  Lab 04/02/17 0847 04/03/17 0628 04/03/17 1301 04/03/17 1614 04/04/17 0530 04/05/17 0724  WBC 14.1* 26.2*  --   --  17.5* 13.9*  CREATININE 1.37* 1.31*  --   --   --   --   LATICACIDVEN 1.4  --  1.2 1.2  --   --     Estimated Creatinine Clearance: 46.3 mL/min (A) (by C-G formula based on SCr of 1.31 mg/dL (H)).    No Known Allergies  Antimicrobials this admission: 12/23 cefepime >> 12/24 12/25 ceftriaxone >>   Microbiology results: 12/23 BCx: No growth 2 days  12/23 UCx: 20,000 colonies E.Coli   12/22 MRSA PCR: Negative   Thank you for allowing pharmacy to be a part of this patient's care.  [place] Resident  04/05/2017 2:52 PM

## 2017-04-05 NOTE — Progress Notes (Addendum)
Randall Boyd at [place] NAME: Randall Boyd    MR#:  [id number]  DATE OF BIRTH:  1931/01/05  SUBJECTIVE:  CHIEF COMPLAINT:   Chief Complaint  Patient presents with  . Shortness of Breath  . Cough   The patient is demented. REVIEW OF SYSTEMS:  Review of Systems  Unable to perform ROS: Mental status change    DRUG ALLERGIES:  No Known Allergies VITALS:  Blood pressure (!) 136/57, pulse 61, temperature 98.1 F (36.7 C), temperature source Oral, resp. rate 18, height 6\' 1"  (1.854 m), weight 171 lb 14.4 oz (78 kg), SpO2 95 %. PHYSICAL EXAMINATION:  Physical Exam  Constitutional: He is well-developed, well-nourished, and in no distress.  HENT:  Head: Normocephalic.  Mouth/Throat: Oropharynx is clear and moist.  Eyes: Conjunctivae and EOM are normal. Pupils are equal, round, and reactive to light. No scleral icterus.  Neck: Normal range of motion. Neck supple. No JVD present. No tracheal deviation present.  Cardiovascular: Normal rate and regular rhythm. Exam reveals no gallop.  Murmur heard. Pulmonary/Chest: Effort normal and breath sounds normal. No respiratory distress. He has no wheezes. He has no rales.  Abdominal: Soft. Bowel sounds are normal. He exhibits no distension. There is no tenderness. There is no rebound.  Musculoskeletal: Normal range of motion. He exhibits no edema or tenderness.  Neurological: No cranial nerve deficit.  Awake but demented, follow commands.  Skin: No rash noted. No erythema.  Psychiatric:  He looks demented.   LABORATORY PANEL:  Male CBC Recent Labs  Lab 04/05/17 0724  WBC 13.9*  HGB 8.7*  HCT 26.9*  PLT 297   ------------------------------------------------------------------------------------------------------------------ Chemistries  Recent Labs  Lab 04/02/17 0847 04/03/17 0628  NA 136 134*  K 3.6 3.9  CL 101 100*  CO2 25 26  GLUCOSE 105* 223*  BUN 24* 21*  CREATININE 1.37* 1.31*   CALCIUM 9.0 8.5*  AST 17  --   ALT 8*  --   ALKPHOS 72  --   BILITOT 0.5  --    RADIOLOGY:  No results found. ASSESSMENT AND PLAN:   Randall Boyd  is a 83 y.o. male with a known history of diabetes mellitus, hypertension, hypothyroidism, anxiety and depression is brought into the ED from assisted living facility and being nauseous and vomiting couple of times last night. Patient's pulse ox was 89% on room air initially but on 2 L it went up to 94%. BNP is elevated with chest x-rays negative. Troponin is slightly elevated at 0.07.   Sepsis, possible due to UTI Per UA: no UTI.  But the urine culture showed Randall Boyd.  Leukocytosis is improving. disontinue cefepime.  Change to Rocephin, blood culture is negative so far.  Acute metabolic encephalopathy due to above.  Improving to baseline. Aspiration and fall precaution.  # elevated troponin-could be from demand ischemia Telemetry monitoring  troponins are stable. Echocardiogram: LV EF: 65% -   70% Chest x-rays negative but BNP is slightly elevated   #Sinus bradycardia-could be from dry heaving, improved. resumed beta blocker metoprolol.  History of chronic CHF.  Unknown type. Recommend diuretic therapy for congestion per Dr. Clayborn Boyd.  #Nausea and vomiting, improved. Hydrated with IV fluids resumed Lasix   #insulin-dependent diabetes mellitus Advanced diet. Hold metformin, increase Lantus to 25 units daily., continue sliding scale insulin   #Essential hypertension Continue home medication terazosin, Norvasc and lisinopril Resumed Lopressor.  CKD Stage III.  Stable.  Generalized weakness.  PT  evaluation: SNF.  All the records are reviewed and case discussed with Care Management/Social Worker. Management plans discussed with the patient, his daughter and they are in agreement.  CODE STATUS: DNR  TOTAL TIME TAKING CARE OF THIS PATIENT: 35 minutes.   More than 50% of the time was spent in counseling/coordination  of care: YES  POSSIBLE D/C IN 1-2 DAYS, DEPENDING ON CLINICAL CONDITION.   Randall Boyd Boyd on 04/05/2017 at 11:41 AM  Between 7am to 6pm - Pager - [phone]  After 6pm go to [email or web address] - [facility]

## 2017-04-05 NOTE — Consult Note (Signed)
Pharmacy Antibiotic Note  Randall Boyd is a 83 y.o. male admitted on 04/02/2017 with aspiration PNA.  Pharmacy has been consulted for zosyn dosing.  Plan: Zosyn 3.375g IV q8h (4 hour infusion).  Height: 6\' 1"  (185.4 cm) Weight: 171 lb 14.4 oz (78 kg) IBW/kg (Calculated) : 79.9  Temp (24hrs), Avg:98.4 F (36.9 C), Min:98.1 F (36.7 C), Max:98.6 F (37 C)  Recent Labs  Lab 04/02/17 0847 04/03/17 0628 04/03/17 1301 04/03/17 1614 04/04/17 0530 04/05/17 0724  WBC 14.1* 26.2*  --   --  17.5* 13.9*  CREATININE 1.37* 1.31*  --   --   --   --   LATICACIDVEN 1.4  --  1.2 1.2  --   --     Estimated Creatinine Clearance: 46.3 mL/min (A) (by C-G formula based on SCr of 1.31 mg/dL (H)).    No Known Allergies  Antimicrobials this admission: ceftriaxone 12/25 >> 12/25 zosyn 12/25 >>   Dose adjustments this admission:   Microbiology results:  12/26 UCx: 20,000 Ecoli    Thank you for allowing pharmacy to be a part of this patient's care.  Ramond Dial, Pharm.D, BCPS Clinical Pharmacist  04/05/2017 6:59 PM

## 2017-04-06 LAB — GLUCOSE, CAPILLARY
GLUCOSE-CAPILLARY: 175 mg/dL — AB (ref 65–99)
GLUCOSE-CAPILLARY: 199 mg/dL — AB (ref 65–99)
Glucose-Capillary: 155 mg/dL — ABNORMAL HIGH (ref 65–99)
Glucose-Capillary: 136 mg/dL — ABNORMAL HIGH (ref 65–99)

## 2017-04-06 LAB — BASIC METABOLIC PANEL
Anion gap: 6 (ref 5–15)
BUN: 22 mg/dL — AB (ref 6–20)
CHLORIDE: 98 mmol/L — AB (ref 101–111)
CO2: 29 mmol/L (ref 22–32)
CREATININE: 1.17 mg/dL (ref 0.61–1.24)
Calcium: 8.3 mg/dL — ABNORMAL LOW (ref 8.9–10.3)
GFR, EST NON AFRICAN AMERICAN: 55 mL/min — AB (ref >60)
Glucose, Bld: 157 mg/dL — ABNORMAL HIGH (ref 65–99)
Potassium: 3.3 mmol/L — ABNORMAL LOW (ref 3.5–5.1)
SODIUM: 133 mmol/L — AB (ref 135–145)

## 2017-04-06 LAB — CBC
HCT: 28.6 % — ABNORMAL LOW (ref 40.0–52.0)
Hemoglobin: 9.4 g/dL — ABNORMAL LOW (ref 13.0–18.0)
MCH: 28.9 pg (ref 26.0–34.0)
MCHC: 33 g/dL (ref 32.0–36.0)
MCV: 87.7 fL (ref 80.0–100.0)
PLATELETS: 284 10*3/uL (ref 150–440)
RBC: 3.26 MIL/uL — AB (ref 4.40–5.90)
RDW: 15.3 % — AB (ref 11.5–14.5)
WBC: 10.5 10*3/uL (ref 3.8–10.6)

## 2017-04-06 LAB — MAGNESIUM: MAGNESIUM: 1.6 mg/dL — AB (ref 1.7–2.4)

## 2017-04-06 MED ORDER — CITALOPRAM HYDROBROMIDE 20 MG PO TABS
20.0000 mg | ORAL_TABLET | Freq: Every day | ORAL | Status: DC
  Administered 2017-04-07: 20 mg via ORAL
  Filled 2017-04-06: qty 1

## 2017-04-06 MED ORDER — GUAIFENESIN-DM 100-10 MG/5ML PO SYRP: 5.0000 mL | ORAL_SOLUTION | ORAL | Status: DC | PRN

## 2017-04-06 MED ORDER — SENNOSIDES-DOCUSATE SODIUM 8.6-50 MG PO TABS: 1.0000 | ORAL_TABLET | Freq: Every evening | ORAL | Status: DC | PRN

## 2017-04-06 MED ORDER — METOPROLOL SUCCINATE ER 25 MG PO TB24: 25.0000 mg | ORAL_TABLET | Freq: Every day | ORAL | Status: DC

## 2017-04-06 MED ORDER — DEXTROMETHORPHAN POLISTIREX ER 30 MG/5ML PO SUER
30.0000 mg | Freq: Two times a day (BID) | ORAL | Status: DC
  Administered 2017-04-06 – 2017-04-07 (×3): 30 mg via ORAL
  Filled 2017-04-06 (×3): qty 5

## 2017-04-06 MED ORDER — BISACODYL 5 MG PO TBEC
5.0000 mg | DELAYED_RELEASE_TABLET | Freq: Every day | ORAL | Status: DC | PRN
  Administered 2017-04-06: 5 mg via ORAL
  Filled 2017-04-06: qty 1

## 2017-04-06 MED ORDER — DM-GUAIFENESIN ER 30-600 MG PO TB12: 1.0000 | ORAL_TABLET | Freq: Two times a day (BID) | ORAL | Status: DC

## 2017-04-06 MED ORDER — POTASSIUM CHLORIDE CRYS ER 20 MEQ PO TBCR
20.0000 meq | EXTENDED_RELEASE_TABLET | Freq: Every day | ORAL | Status: DC
  Administered 2017-04-07: 20 meq via ORAL
  Filled 2017-04-06: qty 1

## 2017-04-06 MED ORDER — POTASSIUM CHLORIDE CRYS ER 20 MEQ PO TBCR
40.0000 meq | EXTENDED_RELEASE_TABLET | Freq: Once | ORAL | Status: AC
  Administered 2017-04-06: 40 meq via ORAL
  Filled 2017-04-06: qty 2

## 2017-04-06 MED ORDER — GUAIFENESIN ER 600 MG PO TB12
600.0000 mg | ORAL_TABLET | Freq: Two times a day (BID) | ORAL | Status: DC
  Administered 2017-04-06 – 2017-04-07 (×3): 600 mg via ORAL
  Filled 2017-04-06 (×3): qty 1

## 2017-04-06 NOTE — Progress Notes (Signed)
Pt has exhibited signs of aspiration when eating/drinking today. He eats very fast and does not finish swallowing what is in his mouth before he adds more food to his mouth and becomes choked. He also takes very large sips of drink and holds them in his mouth before swallowing and he becomes choked up. Coached the patient on taking small sips from the side of a cup rather than a straw. Also helped the patient with meals today, coaching him to take smaller bites and finish swallowing what is in his mouth before adding another bite. He does well with this and has minimal coughing when he has someone to help him with meals. This evening his breathing became somewhat labored (24 breaths/min) and he was having a hard time getting his words out due to the issue breathing. Breath sounds assessed and bilateral lower lobes were diminished, which is a new finding since this morning. O2 saturation was 96% on room air. Dr. Bridgett Larsson notified and chest xray obtained. Chest xray revealed aspiration vs PNA. Notified Dr Bridgett Larsson of CXR results. Speech eval already ordered for the morning. No new orders given at this time. Inquired about making pt NPO, Dr. Bridgett Larsson says he will be ok to eat/drink as long as he has staff there to help him. Daughter at bedside updated on plan of care.

## 2017-04-06 NOTE — Evaluation (Addendum)
Clinical/Bedside Swallow Evaluation Patient Details  Name: Randall Boyd MRN: [id number] Date of Birth: Apr 25, 1930  Today's Date: 04/06/2017 Time: SLP Start Time (ACUTE ONLY): 1045 SLP Stop Time (ACUTE ONLY): 1145 SLP Time Calculation (min) (ACUTE ONLY): 60 min  Past Medical History:  Past Medical History:  Diagnosis Date  . Anxiety   . Depression   . DM (diabetes mellitus) ([place])   . Hypertension   . Hypothyroidism   . Skin cancer    Past Surgical History:  Past Surgical History:  Procedure Laterality Date  . SKIN CANCER EXCISION     HPI:  Pt is a 83 y.o. male with a known history of Dementia per MD, diabetes mellitus, hypertension, hypothyroidism, anxiety and depression is brought into the ED from assisted living facility and being nauseous and vomiting couple of times last night. Patient's pulse ox was 89% on room air initially but on 2 L it went up to 94%. BNP is elevated with chest x-rays negative. Troponin is slightly elevated at 0.07. Patient is bradycardic but denies any dizziness. Pt does exhibit Cognitive decline in his behaviors and follow through w/ tasks; his verbal responses(which were limited). NSG stated pt required Ativan last evening d/t agitation. NSG has reported that pt is impulsive when he feeds himself and does not monitor how much he puts in his mouth - this results in him choking on his foods d/t how much food he has put into his mouth then attempting to drink.    Assessment / Plan / Recommendation Clinical Impression  Pt appears to present w/ adequate oropharyngeal phase swallow function w/ adequate motor strength/ROM for bolus management and oral clearing. No overt s/s of aspiration were noted w/ any trial consistency assessed. However, oral phase was c/b prolonged bolus mastication and time for lingual sweeping to fully clear orally - this was more easily noted when pt was fed by SLP to slow down the eating process overall. Pt's increased oral phase time w/  boluses could be directly related to his Cognitive decline from his dx of Dementia(per MD). All food trials were well-broken down/minced and moistened to aid mastication and oral phase managment. When pt was drinking thin liquids VIA CUP, pt helped to hold the cup, but SLP also held the cup to cue pt to tilt back on the cup to promote Single, SMALL sips vs multiple sips. Again, pt's Cognitive decline would directly affect his overall awareness for such self-monitoring of eating/drinkking slowly using small, single bites/sips and clearing mouth b/f taking another bite. Recommend a more minced diet consistency for bolus management and conservation of energy; thin liquids VIA CUP ONLY. Recommend 100% Supervision w/ all eating/drinking to monitor impulsive eating/drinking behaviors d/t Cognitive decline from Dementia. Recommend aspiration precautions; Pills in Puree w/ NSG. Pt may need a higher level of care d/t Cognitive decline during acute illness and to determine when he may be at a Cognitive level to not require less than 100% supervision during meals. Recommend f/u at discharge for further education w/ pt and staff assisting him but no further skilled ST services indicated at this time. NSG to reconsult if a decline in status while admitted. NSG/CM updated and agreed.  SLP Visit Diagnosis: Dysphagia, unspecified (R13.10)    Aspiration Risk  Mild aspiration risk(w/ impulsive eating behaviors not monitored/adjusted)    Diet Recommendation  Dysphagia diet level 2 (MINCED foods moistened) w/ thin liquids VIA CUP ONLY; aspiration precautions; 100% SUPERVISION AT ALL MEALS to support pt and prevent impulsive  eating/drinking behaviors  Medication Administration: Whole meds with puree(crushed in puree if needed)    Other  Recommendations Recommended Consults: (dietician f/u) Oral Care Recommendations: Oral care BID;Staff/trained caregiver to provide oral care Other Recommendations: (n/a at this time)   Follow  up Recommendations Skilled Nursing facility      Frequency and Duration (TBD) at SNF (TBD) at SNF       Prognosis Prognosis for Safe Diet Advancement: Fair Barriers to Reach Goals: Cognitive deficits;Severity of deficits;Behavior Barriers/Prognosis Comment: impulsive eating/drinking behaviors      Swallow Study   General Date of Onset: 04/02/17 HPI: Pt is a 83 y.o. male with a known history of Dementia per MD, diabetes mellitus, hypertension, hypothyroidism, anxiety and depression is brought into the ED from assisted living facility and being nauseous and vomiting couple of times last night. Patient's pulse ox was 89% on room air initially but on 2 L it went up to 94%. BNP is elevated with chest x-rays negative. Troponin is slightly elevated at 0.07. Patient is bradycardic but denies any dizziness. Pt does exhibit Cognitive decline in his behaviors and follow through w/ tasks; his verbal responses(which were limited). NSG stated pt required Ativan last evening d/t agitation. NSG has reported that pt is impulsive when he feeds himself and does not monitor how much he puts in his mouth - this results in him choking on his foods d/t how much food he has put into his mouth then attempting to drink.  Type of Study: Bedside Swallow Evaluation Previous Swallow Assessment: none reported Diet Prior to this Study: Regular;Thin liquids Temperature Spikes Noted: No(wbc 10.5) Respiratory Status: Room air History of Recent Intubation: No Behavior/Cognition: Alert;Cooperative;Pleasant mood;Confused;Distractible;Requires cueing(eyes closed often) Oral Cavity Assessment: Within Functional Limits Oral Care Completed by SLP: Recent completion by staff Oral Cavity - Dentition: Adequate natural dentition Vision: (eyes closed often - Cognitive decline) Self-Feeding Abilities: Able to feed self;Needs assist;Needs set up;Total assist(100% supervision to monitor bolus amounts) Patient Positioning: Upright in  bed Baseline Vocal Quality: Normal(during phonations) Volitional Cough: Cognitively unable to elicit Volitional Swallow: Unable to elicit    Oral/Motor/Sensory Function Overall Oral Motor/Sensory Function: Within functional limits(w/ bolus management and oral clearing)   Ice Chips Ice chips: Impaired Presentation: Spoon(fed; 2 trials) Oral Phase Impairments: Poor awareness of bolus(increased oral phase time) Oral Phase Functional Implications: Prolonged oral transit Pharyngeal Phase Impairments: (none)   Thin Liquid Thin Liquid: Impaired Presentation: Cup;Self Fed(fully assisted; 10 trials) Oral Phase Impairments: Poor awareness of bolus(impulsive drinking behavior) Oral Phase Functional Implications: (none) Pharyngeal  Phase Impairments: (none) Other Comments: hand over hand holding cup to drink    Nectar Thick Nectar Thick Liquid: Not tested   Honey Thick Honey Thick Liquid: Not tested   Puree Puree: Impaired Presentation: Spoon(fed; 10 trials) Oral Phase Impairments: Poor awareness of bolus Oral Phase Functional Implications: Prolonged oral transit(min ) Pharyngeal Phase Impairments: (none) Other Comments: brought empty hand to mouth as if he were feeding himself - Cognitive decline   Solid   GO   Solid: Impaired Presentation: Spoon(fed; 5 trials) Oral Phase Impairments: Impaired mastication;Poor awareness of bolus(minced the bolus trials) Oral Phase Functional Implications: Impaired mastication;Prolonged oral transit Pharyngeal Phase Impairments: (none) Other Comments: trials were well-broken down    Functional Assessment Tool Used: clinical judgement Functional Limitations: Swallowing Swallow Current Status (Y1017): At least 1 percent but less than 20 percent impaired, limited or restricted Swallow Goal Status [phone]): At least 1 percent but less than 20 percent impaired, limited or  restricted Swallow Discharge Status [id number]): At least 1 percent but less than 20 percent  impaired, limited or restricted    Orinda Kenner, [place], CCC-SLP Watson,Katherine 04/06/2017,1:18 PM

## 2017-04-06 NOTE — Clinical Social Work Note (Signed)
CSW spoke to [employer], and they reported that patient usually transfers himself from the bed to the wheelchair, and the wheelchair to the bathroom.  CSW was informed that patient does walk short distances when he wants to and they do not have lift transfers at ALF.  Patient also normally feeds himself, CSW updated PT and case Freight forwarder.  Jones Broom. Dondrell Loudermilk, MSW, [place]  04/06/2017 11:35 AM

## 2017-04-06 NOTE — Progress Notes (Signed)
[facility] [facility] [place] NAME: Randall Boyd    MR#:  [id number]  DATE OF BIRTH:  04-08-1931  SUBJECTIVE:  CHIEF COMPLAINT:   Chief Complaint  Patient presents with  . Shortness of Breath  . Cough   The patient is demented.  He has cough.  Not on oxygen.  He aspirated yesterday, chest x-ray show lateral base opacity. REVIEW OF SYSTEMS:  Review of Systems  Unable to perform ROS: Mental status change    DRUG ALLERGIES:  No Known Allergies VITALS:  Blood pressure (!) 142/61, pulse 70, temperature 97.7 F (36.5 C), temperature source Oral, resp. rate 20, height 6\' 1"  (1.854 m), weight 170 lb 12.8 oz (77.5 kg), SpO2 94 %. PHYSICAL EXAMINATION:  Physical Exam  Constitutional: He is well-developed, well-nourished, and in no distress.  HENT:  Head: Normocephalic.  Mouth/Throat: Oropharynx is clear and moist.  Eyes: Conjunctivae and EOM are normal. Pupils are equal, round, and reactive to light. No scleral icterus.  Neck: Normal range of motion. Neck supple. No JVD present. No tracheal deviation present.  Cardiovascular: Normal rate and regular rhythm. Exam reveals no gallop.  Murmur heard. Pulmonary/Chest: Effort normal. No respiratory distress. He has no wheezes. He has no rales.  Bilateral basilar crackles  Abdominal: Soft. Bowel sounds are normal. He exhibits no distension. There is no tenderness. There is no rebound.  Musculoskeletal: Normal range of motion. He exhibits no edema or tenderness.  Neurological: No cranial nerve deficit.  Awake but demented, follow commands.  Skin: No rash noted. No erythema.  Psychiatric:  demented.   LABORATORY PANEL:  Male CBC Recent Labs  Lab 04/06/17 0404  WBC 10.5  HGB 9.4*  HCT 28.6*  PLT 284   ------------------------------------------------------------------------------------------------------------------ Chemistries  Recent Labs  Lab 04/02/17 0847  04/06/17 0404  NA 136   < >  133*  K 3.6   < > 3.3*  CL 101   < > 98*  CO2 25   < > 29  GLUCOSE 105*   < > 157*  BUN 24*   < > 22*  CREATININE 1.37*   < > 1.17  CALCIUM 9.0   < > 8.3*  MG  --   --  1.6*  AST 17  --   --   ALT 8*  --   --   ALKPHOS 72  --   --   BILITOT 0.5  --   --    < > = values in this interval not displayed.   RADIOLOGY:  Dg Chest Port 1 View  Result Date: 04/05/2017 CLINICAL DATA:  Increase shortness of breath EXAM: PORTABLE CHEST 1 VIEW COMPARISON:  Three days ago FINDINGS: Chronic cardiomegaly. There are coarse interstitial opacities at the bases that are chronic based on priors. Increasing mild patchy density at the bases. No Kerley lines, effusion, or pneumothorax. IMPRESSION: 1. Patchy opacity at both bases that is new from 3 days prior. Please correlate for symptoms of pneumonia or aspiration. 2. Background chronic interstitial opacities. Electronically Signed   By: Monte Fantasia M.D.   On: 04/05/2017 17:34   ASSESSMENT AND PLAN:   Randall Boyd  is a 83 y.o. male with a known history of diabetes mellitus, hypertension, hypothyroidism, anxiety and depression is brought into the ED from assisted living facility and being nauseous and vomiting couple of times last night. Patient's pulse ox was 89% on room air initially but on 2 L it went up to  94%. BNP is elevated with chest x-rays negative. Troponin is slightly elevated at 0.07.   Sepsis, possible due to UTI Per UA: unremarkable.  But the urine culture showed E. Coli.  Leukocytosis is improved. disontinued cefepime.  Changed to Rocephin then changed to zosyn due to aspiration PNA, blood culture is negative so far.  Aspiration pneumonia. Started on [place] to dose.  Aspiration precaution.  Speech study suggest that the patient needed assistant feeding.  Dysphagia 2 diet.  Hypomagnesemia.  Given magnesium IV and follow-up level.  Acute metabolic encephalopathy due to above.  Improving to baseline. Aspiration and fall  precaution.  # elevated troponin-could be from demand ischemia Telemetry monitoring  troponins are stable. Echocardiogram: LV EF: 65% -   70% Chest x-rays negative but BNP is slightly elevated   #Sinus bradycardia-could be from dry heaving, improved. resumed beta blocker metoprolol.  History of chronic CHF.  Unknown type. Recommend diuretic therapy for congestion per Dr. Clayborn Bigness.  #Nausea and vomiting, improved. Hydrated with IV fluids resumed Lasix   #insulin-dependent diabetes mellitus Advanced diet. Hold metformin, increase Lantus to 25 units daily., continue sliding scale insulin   #Essential hypertension Continue home medication terazosin, Norvasc and lisinopril Resumed Lopressor.  CKD Stage III.  Stable.  Generalized weakness.  PT evaluation: SNF.  All the records are reviewed and case discussed with Care Management/Social Worker. Management plans discussed with the patient, his daughter and they are in agreement.  CODE STATUS: DNR  TOTAL TIME TAKING CARE OF THIS PATIENT: 28 minutes.   More than 50% of the time was spent in counseling/coordination of care: YES  POSSIBLE D/C IN 1-2 DAYS, DEPENDING ON CLINICAL CONDITION.   [name] M.D on 04/06/2017 at 1:37 PM  Between 7am to 6pm - Pager - [phone]  After 6pm go to [email or web address] - [facility]

## 2017-04-06 NOTE — Evaluation (Signed)
Physical Therapy Evaluation Patient Details Name: Randall Boyd MRN: [id number] DOB: 02-05-1931 Today's Date: 04/06/2017   History of Present Illness  Pt is an 83 y.o. male presenting to hospital with SOB, cough, and N/V; found to have elevated troponin.  Pt admitted with sepsis, acute metabolic encephalopathy, and sinus bradycardia.  PMH includes anxiety, depression, htn, DM, skin CA.  Clinical Impression  Prior to hospital admission, pt was able to perform transfers bed to chair to toilet by himself and also able to walk short distances when he wanted to.  Pt lives at [facility].  Pt oriented to self but appearing confused during session; inconsistent with following simple one step directions.  Currently pt is max assist with bed mobility and requires assist with sitting balance.  Pt did not attempt to assist with standing (pt holding his head and appeared to have a HA but pt inconsistent when asked if he had a HA; nursing notified).  Pt would benefit from skilled PT to trial PT in order to attempt to address noted impairments and functional limitations (see below for any additional details).  Currently recommend STR (trial PT) but pt may overall require higher level of care if participation/progress does not improve.    Follow Up Recommendations SNF(trial PT)    Equipment Recommendations  None recommended by PT    Recommendations for Other Services       Precautions / Restrictions Precautions Precautions: Fall Precaution Comments: Aspiration Restrictions Weight Bearing Restrictions: No      Mobility  Bed Mobility Overal bed mobility: Needs Assistance Bed Mobility: Supine to Sit;Sit to Supine     Supine to sit: Max assist;HOB elevated Sit to supine: Max assist;HOB elevated   General bed mobility comments: assist for trunk and B LE's supine to/from sit with vc's and tactile cues for technique  Transfers Overall transfer level: Needs assistance Equipment used:  None Transfers: Sit to/from Stand Sit to Stand: Total assist         General transfer comment: unable to stand pt with 1 assist and B knees block; pt holding his head and no initiation with LE's noted  Ambulation/Gait                Stairs            Wheelchair Mobility    Modified Rankin (Stroke Patients Only)       Balance Overall balance assessment: Needs assistance Sitting-balance support: Bilateral upper extremity supported;Feet supported Sitting balance-Leahy Scale: Poor Sitting balance - Comments: pt requiring mod to max assist for sitting balance (otherwise pt leaning into bed on L side)                                     Pertinent Vitals/Pain Pain Assessment: Faces Faces Pain Scale: Hurts whole lot Pain Location: pt holding his head Pain Descriptors / Indicators: Grimacing;Other (Comment)(holding his head) Pain Intervention(s): Limited activity within patient's tolerance;Monitored during session;Repositioned(RN notified of this pain-like behavior)  Vitals (HR and O2 on room air) stable and WFL throughout treatment session.    Home Living Family/patient expects to be discharged to:: Assisted living               Home Equipment: Gilford Rile - 2 wheels Additional Comments: Lives at [facility].    Prior Function           Comments: Per SW (who talked with  ALF) pt was independent with transfers (bed to chair to toilet) and ambulated short distances when he wanted to (per chart used walker).     Hand Dominance        Extremity/Trunk Assessment   Upper Extremity Assessment Upper Extremity Assessment: Generalized weakness;Difficult to assess due to impaired cognition    Lower Extremity Assessment Lower Extremity Assessment: Generalized weakness;Difficult to assess due to impaired cognition       Communication   Communication: No difficulties  Cognition Arousal/Alertness: Awake/alert Behavior During Therapy:  Flat affect Overall Cognitive Status: No family/caregiver present to determine baseline cognitive functioning(Oriented to person)                                        General Comments General comments (skin integrity, edema, etc.): Pt sleeping in bed upon PT arrival.  Nursing cleared pt for participation in physical therapy.  Pt agreeable to PT session.    Exercises     Assessment/Plan    PT Assessment Patient needs continued PT services  PT Problem List Decreased strength;Decreased activity tolerance;Decreased balance;Decreased mobility       PT Treatment Interventions DME instruction;Gait training;Functional mobility training;Therapeutic activities;Therapeutic exercise;Balance training;Patient/family education    PT Goals (Current goals can be found in the Care Plan section)  Acute Rehab PT Goals Patient Stated Goal: to improve mobility PT Goal Formulation: With patient Time For Goal Achievement: 04/20/17 Potential to Achieve Goals: Fair    Frequency (Trial PT)   Barriers to discharge Decreased caregiver support      Co-evaluation               AM-PAC PT "6 Clicks" Daily Activity  Outcome Measure Difficulty turning over in bed (including adjusting bedclothes, sheets and blankets)?: Unable Difficulty moving from lying on back to sitting on the side of the bed? : Unable Difficulty sitting down on and standing up from a chair with arms (e.g., wheelchair, bedside commode, etc,.)?: Unable Help needed moving to and from a bed to chair (including a wheelchair)?: Total Help needed walking in hospital room?: Total Help needed climbing 3-5 steps with a railing? : Total 6 Click Score: 6    End of Session Equipment Utilized During Treatment: Gait belt Activity Tolerance: Other (comment)(Limited d/t pt's participation (pt holding head and appearing to have HA)) Patient left: in bed;with call bell/phone within reach;with bed alarm set Nurse Communication:  Mobility status;Precautions;Other (comment)(pt appearing to have HA) PT Visit Diagnosis: Other abnormalities of gait and mobility (R26.89);Muscle weakness (generalized) (M62.81)    Time: 1601-0932 PT Time Calculation (min) (ACUTE ONLY): 23 min   Charges:   PT Evaluation $PT Eval Low Complexity: 1 Low     PT G Codes:   PT G-Codes **NOT FOR INPATIENT CLASS** Functional Assessment Tool Used: AM-PAC 6 Clicks Basic Mobility Functional Limitation: Mobility: Walking and moving around Mobility: Walking and Moving Around Current Status (T5573): 100 percent impaired, limited or restricted Mobility: Walking and Moving Around Goal Status (U2025): At least 40 percent but less than 60 percent impaired, limited or restricted    Leitha Bleak, PT 04/06/17, 3:39 PM [phone]

## 2017-04-06 NOTE — Care Management (Signed)
Spoke with SLP.  If patient returns to the assisted level of care, must receive constant uninterrupted supervision during all meals as patient is at risk for aspiration due to behavior of putting too much food in his mouth at one time.  CSW updated

## 2017-04-07 LAB — BASIC METABOLIC PANEL
Anion gap: 9 (ref 5–15)
BUN: 24 mg/dL — ABNORMAL HIGH (ref 6–20)
CHLORIDE: 100 mmol/L — AB (ref 101–111)
CO2: 28 mmol/L (ref 22–32)
CREATININE: 1.45 mg/dL — AB (ref 0.61–1.24)
Calcium: 8.4 mg/dL — ABNORMAL LOW (ref 8.9–10.3)
GFR calc non Af Amer: 43 mL/min — ABNORMAL LOW (ref >60)
GFR, EST AFRICAN AMERICAN: 49 mL/min — AB (ref >60)
Glucose, Bld: 151 mg/dL — ABNORMAL HIGH (ref 65–99)
Potassium: 3.6 mmol/L (ref 3.5–5.1)
Sodium: 137 mmol/L (ref 135–145)

## 2017-04-07 LAB — GLUCOSE, CAPILLARY: GLUCOSE-CAPILLARY: 162 mg/dL — AB (ref 65–99)

## 2017-04-07 LAB — MAGNESIUM: Magnesium: 1.7 mg/dL (ref 1.7–2.4)

## 2017-04-07 MED ORDER — SODIUM CHLORIDE 0.9 % IV SOLN
INTRAVENOUS | Status: DC
Start: 2017-04-07 — End: 2017-04-07

## 2017-04-07 MED ORDER — CITALOPRAM HYDROBROMIDE 20 MG PO TABS: 20.0000 mg | ORAL_TABLET | Freq: Every day | ORAL | 0 refills | Status: AC

## 2017-04-07 MED ORDER — MAGNESIUM SULFATE 2 GM/50ML IV SOLN
2.0000 g | Freq: Once | INTRAVENOUS | Status: AC
  Administered 2017-04-07: 2 g via INTRAVENOUS
  Filled 2017-04-07: qty 50

## 2017-04-07 MED ORDER — AMOXICILLIN-POT CLAVULANATE 250-62.5 MG/5ML PO SUSR: 500.0000 mg | Freq: Two times a day (BID) | ORAL | 0 refills | Status: AC

## 2017-04-07 NOTE — Care Management Important Message (Signed)
Important Message  Patient Details  Name: Randall Boyd MRN: [id number] Date of Birth: [birth year]   Medicare Important Message Given:  Yes    Katrina Stack, RN 04/07/2017, 12:10 PM

## 2017-04-07 NOTE — Clinical Social Work Note (Addendum)
Patient to be d/c'ed today to [facility].  Patient and family agreeable to plans will transport via ems RN to call report.  CSW updated patient's daughter.  Evette Cristal, MSW, [place]

## 2017-04-07 NOTE — Discharge Instructions (Signed)
Aspiration and fall precaution. Feeding assisting Dysphagia 2 heart healthy, low sodium and ADA diet, daily weight.

## 2017-04-07 NOTE — Discharge Summary (Signed)
[place] [facility] [place] NAME: Randall Boyd    MR#:  [id number]  DATE OF BIRTH:  07-Mar-1931  DATE OF ADMISSION:  04/02/2017   ADMITTING PHYSICIAN: Nicholes Mango, MD  DATE OF DISCHARGE: 04/07/2017 PRIMARY CARE PHYSICIAN: Velta Addison, Claretha Cooper, DO   ADMISSION DIAGNOSIS:  Bradycardia [R00.1] Elevated troponin [R74.8] Elevated brain natriuretic peptide (BNP) level [R79.89] Dyspnea, unspecified type [R06.00] DISCHARGE DIAGNOSIS:  Active Problems:   Elevated troponin   Acute metabolic encephalopathy  SECONDARY DIAGNOSIS:   Past Medical History:  Diagnosis Date  . Anxiety   . Depression   . DM (diabetes mellitus) ([place])   . Hypertension   . Hypothyroidism   . Skin cancer    HOSPITAL COURSE:   [facility]Banneris a83 y.o.malewith a known history of diabetes mellitus, hypertension, hypothyroidism, anxiety and depression is brought into the ED from assisted living facility and being nauseous and vomiting couple of times last night. Patient's pulse ox was 89% on room air initially but on 2 L it went up to 94%. BNP is elevated with chest x-rays negative. Troponin is slightly elevated at 0.07.  Sepsis, possible due to UTI Per UA: unremarkable.  But the urine culture showed E. Coli.  Leukocytosis is improved. disontinued cefepime.  Changed to Rocephin then changed to zosyn due to aspiration PNA, blood culture is negative so far.  Aspiration pneumonia. Started on [place] to dose.  Aspiration precaution.  Speech study suggest that the patient needed assistant feeding.  Dysphagia 2 diet. Change to augmentin po for 5 days.  Hypomagnesemia.  Given magnesium IV and improved.  Acute metabolic encephalopathy due to above.  Improving to baseline. Aspiration and fall precaution.  #elevated troponin-could be from demand ischemia Telemetry monitoring  troponins are stable. Echocardiogram: LV EF: 65% - 70% Chest x-rays negative but BNP  is slightly elevated  #Sinus bradycardia-could be from dry heaving, improved. resumed beta blocker metoprolol.  History of chronic CHF.  Unknown type. Recommend diuretic therapy for congestion per Dr. Clayborn Bigness.  #Nausea and vomiting, improved. Hydrated with IV fluids Hold Lasix due to low BP.  #insulin-dependent diabetes mellitus Advanced diet. Hold metformin, increase Lantus to 25 units daily., continue sliding scale insulin   #Essential hypertension, BP is low side. Continue home medication lopressor, discontinue lasix, Norvasc and lisinopril Resumed Lopressor.  CKD Stage III.  Stable.  Generalized weakness.  PT evaluation: SNF. DISCHARGE CONDITIONS:  Stable, discharge to SNF today. CONSULTS OBTAINED:  Treatment Team:  Yolonda Kida, MD DRUG ALLERGIES:  No Known Allergies DISCHARGE MEDICATIONS:   Allergies as of 04/07/2017   No Known Allergies     Medication List    STOP taking these medications   amLODipine 5 MG tablet Commonly known as:  NORVASC   furosemide 20 MG tablet Commonly known as:  LASIX   lisinopril 40 MG tablet Commonly known as:  PRINIVIL,ZESTRIL   LORazepam 0.5 MG tablet Commonly known as:  ATIVAN   metFORMIN 1000 MG tablet Commonly known as:  GLUCOPHAGE     TAKE these medications   acetaminophen 325 MG tablet Commonly known as:  TYLENOL Take 650 mg by mouth every 6 (six) hours as needed.   amoxicillin-clavulanate 250-62.5 MG/5ML suspension Commonly known as:  AUGMENTIN Take 10 mLs (500 mg total) by mouth 2 (two) times daily for 10 days. For 5 days.   atorvastatin 20 MG tablet Commonly known as:  LIPITOR Take 20 mg by mouth daily.   citalopram 20  MG tablet Commonly known as:  CELEXA Take 1 tablet (20 mg total) by mouth daily. What changed:    medication strength  how much to take   DIABETIC TUSSIN EX 100 MG/5ML liquid Generic drug:  guaiFENesin Take 200 mg by mouth 3 (three) times daily as needed for  cough.   donepezil 10 MG tablet Commonly known as:  ARICEPT Take 10 mg by mouth at bedtime.   finasteride 5 MG tablet Commonly known as:  PROSCAR Take 5 mg by mouth daily.   insulin lispro 100 UNIT/ML injection Commonly known as:  HUMALOG Inject 0-10 Units into the skin 3 (three) times daily before meals. BS <80, give 0 units BS 81-125, give 4 units BS 126-200, give 8 units BS >201, give 10 units   LANTUS SOLOSTAR 100 UNIT/ML Solostar Pen Generic drug:  Insulin Glargine Inject 25 Units into the muscle daily.   levothyroxine 100 MCG tablet Commonly known as:  SYNTHROID, LEVOTHROID Take 100 mcg by mouth daily.   loratadine 10 MG tablet Commonly known as:  CLARITIN Take 10 mg by mouth daily as needed for allergies.   metoprolol tartrate 25 MG tablet Commonly known as:  LOPRESSOR Take 25 mg by mouth daily.   potassium chloride 10 MEQ tablet Commonly known as:  K-DUR Take 10 mEq by mouth daily.   psyllium 0.52 g capsule Commonly known as:  REGULOID Take 0.52 g by mouth daily.   ranitidine 150 MG tablet Commonly known as:  ZANTAC Take 150 mg by mouth 2 (two) times daily.   risperiDONE 0.25 MG tablet Commonly known as:  RISPERDAL Take 0.25 mg by mouth daily.   saccharomyces boulardii 250 MG capsule Commonly known as:  FLORASTOR Take 250 mg by mouth daily.   tamsulosin 0.4 MG Caps capsule Commonly known as:  FLOMAX Take 0.4 mg by mouth every evening.   terazosin 5 MG capsule Commonly known as:  HYTRIN Take 5 mg by mouth every evening.        DISCHARGE INSTRUCTIONS:  See AVS.  If you experience worsening of your admission symptoms, develop shortness of breath, life threatening emergency, suicidal or homicidal thoughts you must seek medical attention immediately by calling 911 or calling your MD immediately  if symptoms less severe.  You Must read complete instructions/literature along with all the possible adverse reactions/side effects for all the  Medicines you take and that have been prescribed to you. Take any new Medicines after you have completely understood and accpet all the possible adverse reactions/side effects.   Please note  You were cared for by a hospitalist during your hospital stay. If you have any questions about your discharge medications or the care you received while you were in the hospital after you are discharged, you can call the unit and asked to speak with the hospitalist on call if the hospitalist that took care of you is not available. Once you are discharged, your primary care physician will handle any further medical issues. Please note that NO REFILLS for any discharge medications will be authorized once you are discharged, as it is imperative that you return to your primary care physician (or establish a relationship with a primary care physician if you do not have one) for your aftercare needs so that they can reassess your need for medications and monitor your lab values.    On the day of Discharge:  VITAL SIGNS:  Blood pressure (!) 112/40, pulse 70, temperature 98.7 F (37.1 C), temperature source Oral, resp.  rate 18, height 6\' 1"  (1.854 m), weight 165 lb 3.2 oz (74.9 kg), SpO2 97 %. PHYSICAL EXAMINATION:  GENERAL:  83 y.o.-year-old patient lying in the bed with no acute distress.  EYES: Pupils equal, round, reactive to light and accommodation. No scleral icterus. Extraocular muscles intact.  HEENT: Head atraumatic, normocephalic.   NECK:  Supple, no jugular venous distention. No thyroid enlargement, no tenderness.  LUNGS: Normal breath sounds bilaterally, no wheezing, rales,rhonchi or crepitation. No use of accessory muscles of respiration.  CARDIOVASCULAR: S1, S2 normal. No murmurs, rubs, or gallops.  ABDOMEN: Soft, non-tender, non-distended. Bowel sounds present. EXTREMITIES: No pedal edema, cyanosis, or clubbing.  NEUROLOGIC: unable to exam. PSYCHIATRIC: The patient is demented. SKIN: No obvious  rash, lesion, or ulcer.  DATA REVIEW:   CBC Recent Labs  Lab 04/06/17 0404  WBC 10.5  HGB 9.4*  HCT 28.6*  PLT 284    Chemistries  Recent Labs  Lab 04/02/17 0847  04/07/17 0242  NA 136   < > 137  K 3.6   < > 3.6  CL 101   < > 100*  CO2 25   < > 28  GLUCOSE 105*   < > 151*  BUN 24*   < > 24*  CREATININE 1.37*   < > 1.45*  CALCIUM 9.0   < > 8.4*  MG  --    < > 1.7  AST 17  --   --   ALT 8*  --   --   ALKPHOS 72  --   --   BILITOT 0.5  --   --    < > = values in this interval not displayed.     Microbiology Results  Results for orders placed or performed during the hospital encounter of 04/02/17  MRSA PCR Screening     Status: None   Collection Time: 04/02/17  5:11 PM  Result Value Ref Range Status   MRSA by PCR NEGATIVE NEGATIVE Final    Comment:        The GeneXpert MRSA Assay (FDA approved for NASAL specimens only), is one component of a comprehensive MRSA colonization surveillance program. It is not intended to diagnose MRSA infection nor to guide or monitor treatment for MRSA infections. Performed at [facility], [address] [place], [place] [address]   Culture, blood (x 2)     Status: None (Preliminary result)   Collection Time: 04/03/17  1:01 PM  Result Value Ref Range Status   Specimen Description BLOOD LEFT HAND  Final   Special Requests   Final    BOTTLES DRAWN AEROBIC AND ANAEROBIC Blood Culture adequate volume   Culture   Final    NO GROWTH 4 DAYS Performed at [facility], [address], [place], [place] [address]    Report Status PENDING  Incomplete  Culture, blood (x 2)     Status: None (Preliminary result)   Collection Time: 04/03/17  1:12 PM  Result Value Ref Range Status   Specimen Description BLOOD RIGHT WRIST  Final   Special Requests BOTTLES DRAWN AEROBIC AND ANAEROBIC [place]  Final   Culture   Final    NO GROWTH 4 DAYS Performed at [facility], [address] [place], [place] [address]     Report Status PENDING  Incomplete  Urine Culture     Status: Abnormal   Collection Time: 04/03/17  1:49 PM  Result Value Ref Range Status   Specimen Description   Final  URINE, RANDOM Performed at [facility], [address] [place], [place] [address]    Special Requests   Final    NONE Performed at [facility], [place], [place] [address]    Culture 20,000 COLONIES/mL ESCHERICHIA COLI (A)  Final   Report Status 04/05/2017 FINAL  Final   Organism ID, Bacteria ESCHERICHIA COLI (A)  Final      Susceptibility   Escherichia coli - MIC*    AMPICILLIN <=2 SENSITIVE Sensitive     CEFAZOLIN <=4 SENSITIVE Sensitive     CEFTRIAXONE <=1 SENSITIVE Sensitive     CIPROFLOXACIN <=0.25 SENSITIVE Sensitive     GENTAMICIN <=1 SENSITIVE Sensitive     IMIPENEM <=0.25 SENSITIVE Sensitive     NITROFURANTOIN <=16 SENSITIVE Sensitive     TRIMETH/SULFA <=20 SENSITIVE Sensitive     AMPICILLIN/SULBACTAM <=2 SENSITIVE Sensitive     PIP/TAZO <=4 SENSITIVE Sensitive     Extended ESBL NEGATIVE Sensitive     * 20,000 COLONIES/mL ESCHERICHIA COLI    RADIOLOGY:  No results found.   Management plans discussed with the patient,his daughter and they are in agreement.  CODE STATUS: DNR   TOTAL TIME TAKING CARE OF THIS PATIENT: 38 minutes.    [name] M.D on 04/07/2017 at 9:06 AM  Between 7am to 6pm - Pager - [phone]  After 6pm go to [email or web address] - password [email or web address] [facility]  [facility] [place] [facility]  Office  [phone]  CC: Primary care physician; Velta Addison, Claretha Cooper, DO   Note: This dictation was prepared with Dragon dictation along with smaller phrase technology. Any transcriptional errors that result from this process are unintentional.

## 2017-04-07 NOTE — Progress Notes (Signed)
Report called to [employer] and EMS called for transportation. Patient prepared for EMS to pick up. No distress at this time.

## 2017-04-08 LAB — CULTURE, BLOOD (ROUTINE X 2)
CULTURE: NO GROWTH
CULTURE: NO GROWTH
Special Requests: ADEQUATE

## 2017-04-21 ENCOUNTER — Other Ambulatory Visit: Payer: Self-pay | Admitting: Gerontology

## 2017-04-21 DIAGNOSIS — R1312 Dysphagia, oropharyngeal phase: Secondary | ICD-10-CM

## 2017-05-16 ENCOUNTER — Ambulatory Visit: Admission: RE | Admit: 2017-05-16 | Payer: Medicare Other | Source: Ambulatory Visit

## 2017-06-10 DEATH — deceased

## 2018-07-12 IMAGING — DX DG CHEST 1V PORT
1 series · 2 of 2 positions shown · non-contrast
Comparison: Three days ago

CLINICAL DATA: Increase shortness of breath

EXAM:
PORTABLE CHEST 1 VIEW

[Series 1: chest ap · 0.14mm/px · 2 of 2 slices shown]
[im 1/2]
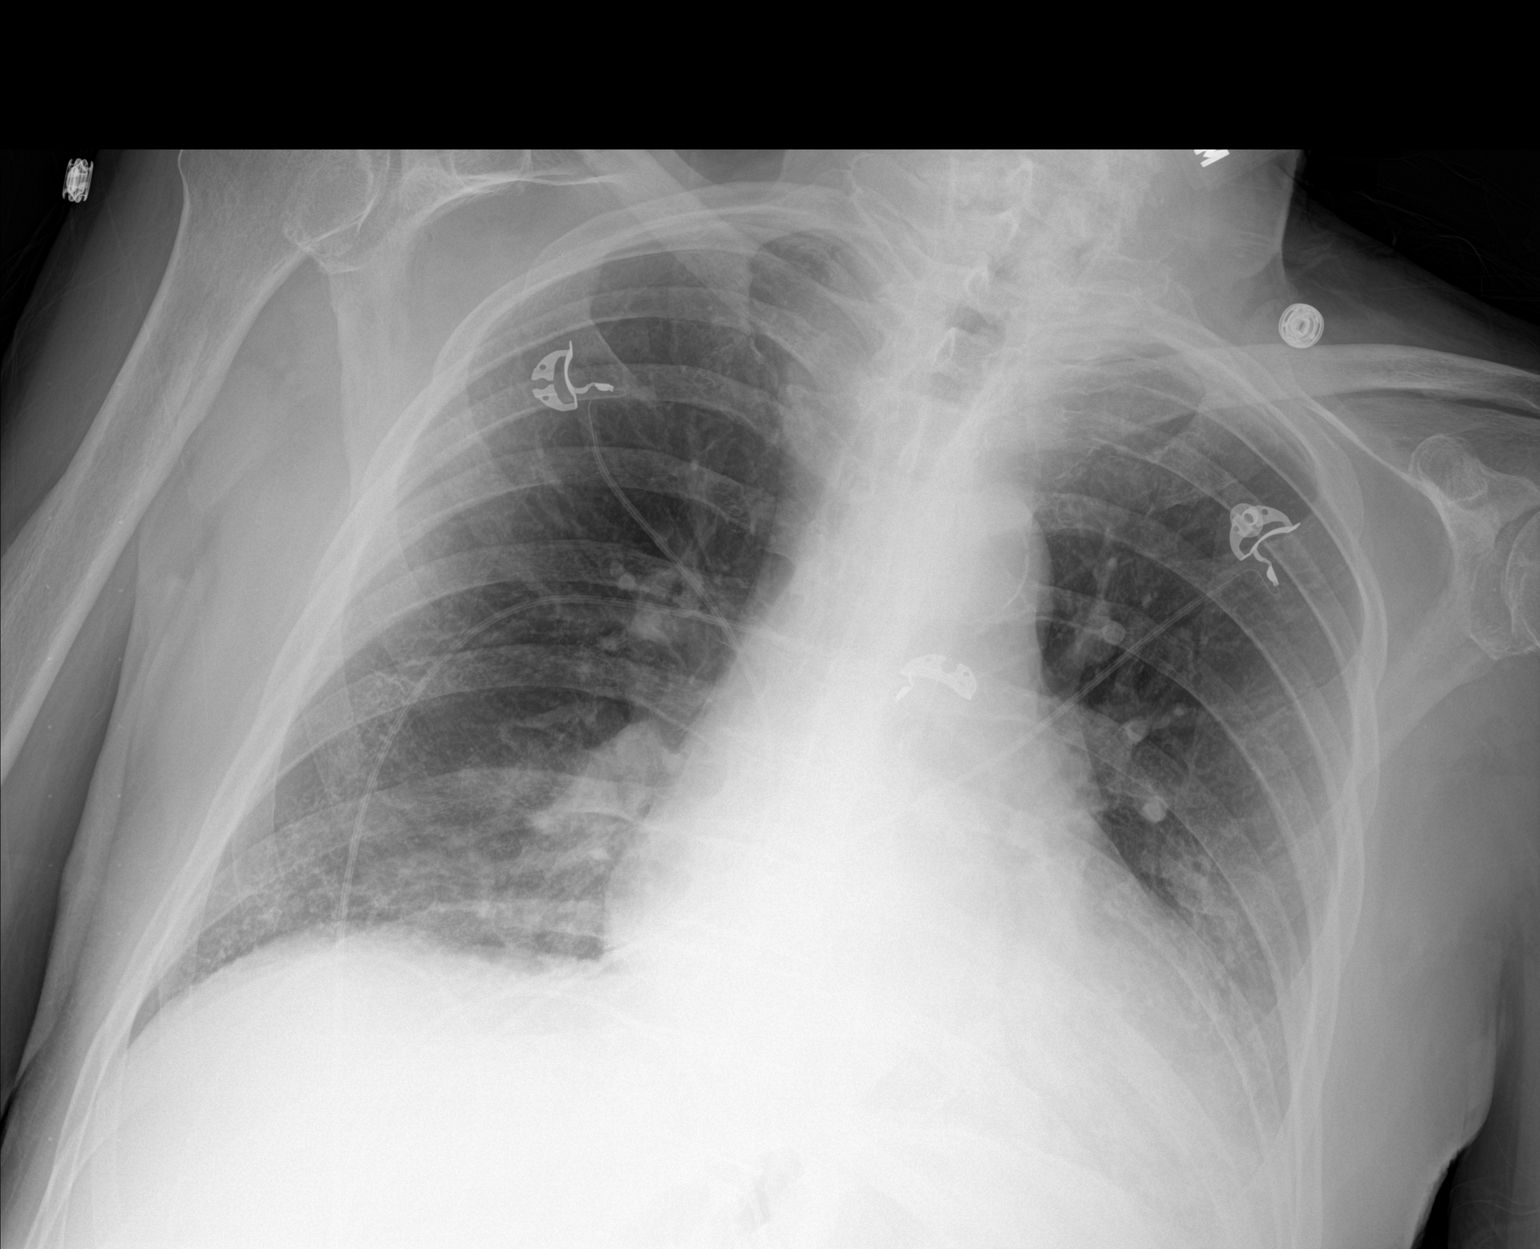
[im 2/2]
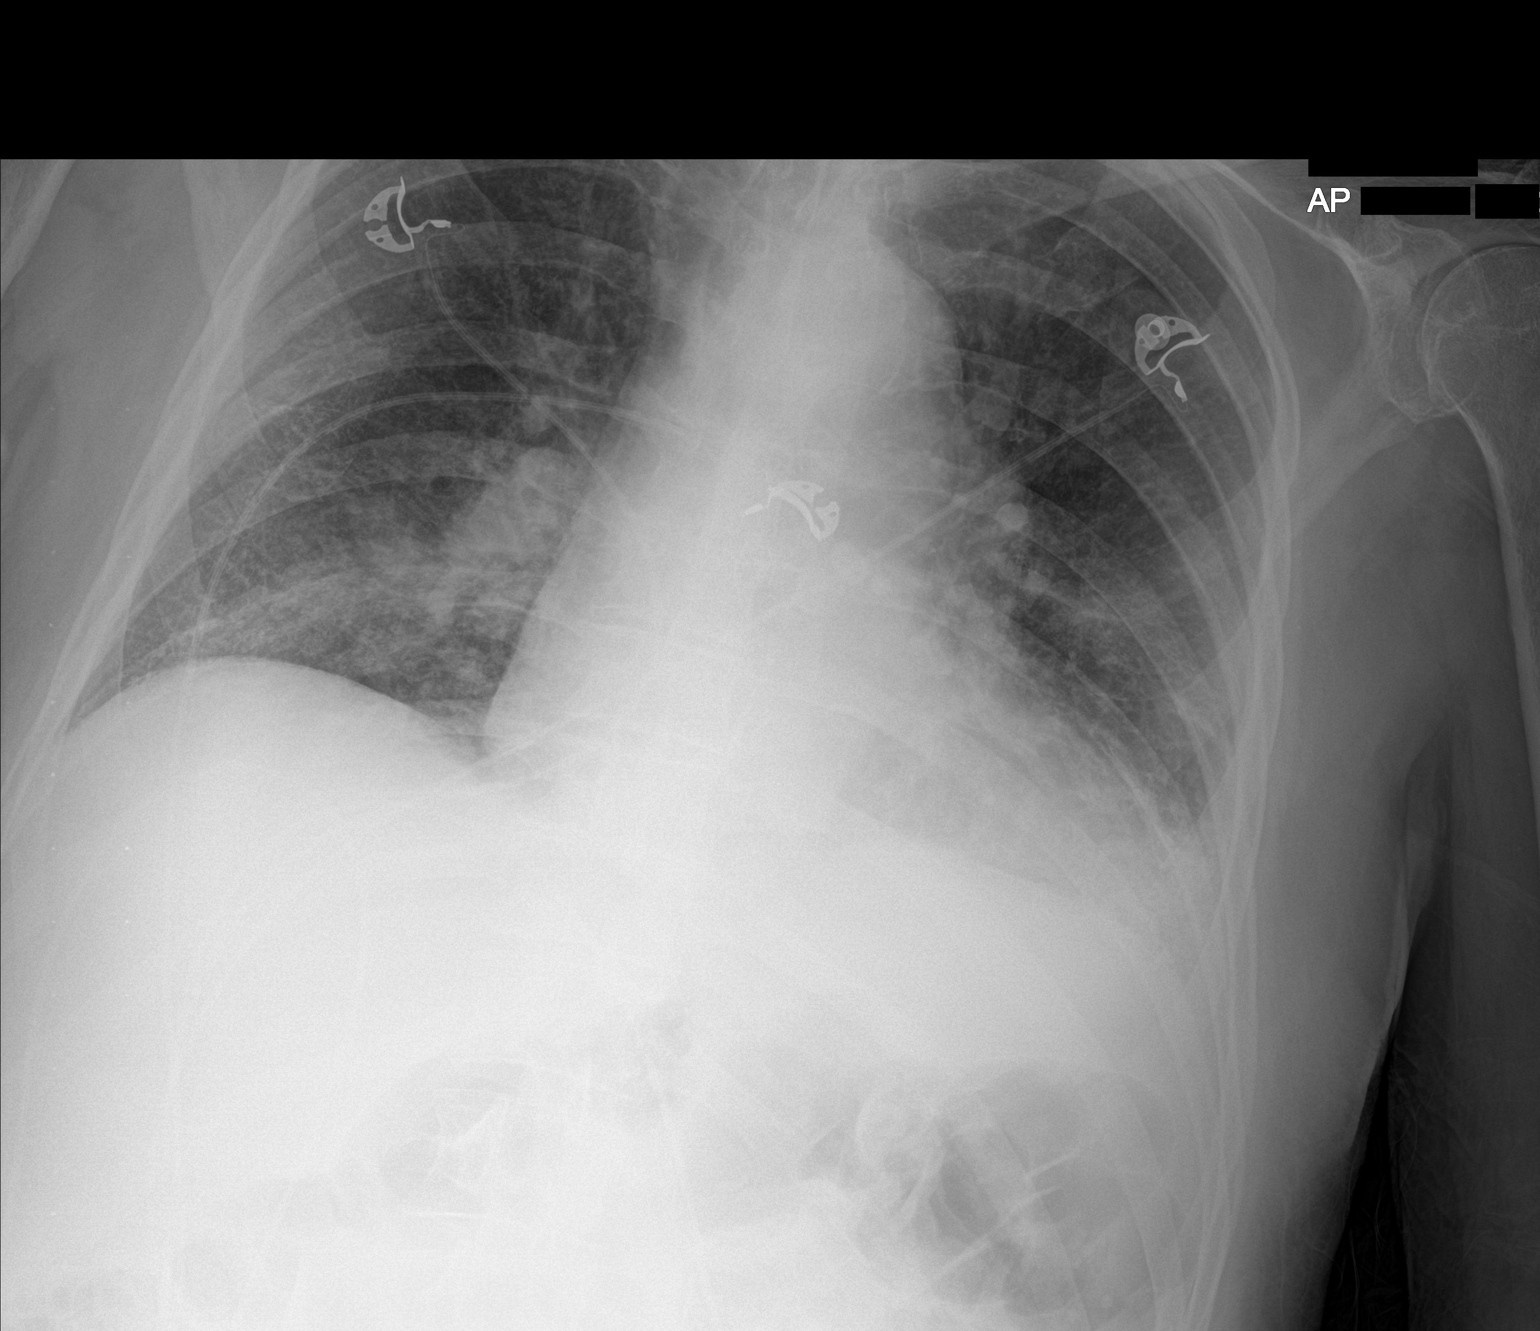

[2 of 2 positions shown; findings below may reference images not displayed]

FINDINGS: Chronic cardiomegaly. There are coarse interstitial opacities at the
bases that are chronic based on priors. Increasing mild patchy
density at the bases. No Kerley lines, effusion, or pneumothorax.
IMPRESSION: 1. Patchy opacity at both bases that is new from 3 days prior.
Please correlate for symptoms of pneumonia or aspiration.
2. Background chronic interstitial opacities.
